# Patient Record
Sex: Female | Born: 1995 | Hispanic: Yes | Marital: Single | Smoking: Never smoker
Health system: Southern US, Community
[De-identification: ages and names within clinical notes are randomized; demographics above are authoritative.]

## PROBLEM LIST (undated history)

## (undated) DIAGNOSIS — G43909 Migraine, unspecified, not intractable, without status migrainosus: Secondary | ICD-10-CM

## (undated) DIAGNOSIS — F32A Depression, unspecified: Secondary | ICD-10-CM

## (undated) DIAGNOSIS — E282 Polycystic ovarian syndrome: Secondary | ICD-10-CM

## (undated) HISTORY — DX: Migraine, unspecified, not intractable, without status migrainosus: G43.909

## (undated) HISTORY — PX: NO PAST SURGERIES: SHX2092

## (undated) HISTORY — DX: Depression, unspecified: F32.A

---

## 2017-08-06 ENCOUNTER — Emergency Department (HOSPITAL_COMMUNITY): Payer: Self-pay

## 2017-08-06 ENCOUNTER — Encounter (HOSPITAL_COMMUNITY): Payer: Self-pay | Admitting: Emergency Medicine

## 2017-08-06 ENCOUNTER — Emergency Department (HOSPITAL_COMMUNITY)
Admission: EM | Admit: 2017-08-06 | Discharge: 2017-08-06 | Disposition: A | Discharge status: Home / Self Care | Payer: Self-pay | Attending: Emergency Medicine | Admitting: Emergency Medicine

## 2017-08-06 DIAGNOSIS — G43009 Migraine without aura, not intractable, without status migrainosus: Secondary | ICD-10-CM | POA: Insufficient documentation

## 2017-08-06 HISTORY — DX: Polycystic ovarian syndrome: E28.2

## 2017-08-06 HISTORY — DX: Migraine, unspecified, not intractable, without status migrainosus: G43.909

## 2017-08-06 LAB — CBC WITH DIFFERENTIAL/PLATELET
BASOS PCT: 0 %
Basophils Absolute: 0 10*3/uL (ref 0.0–0.1)
EOS ABS: 0.1 10*3/uL (ref 0.0–0.7)
Eosinophils Relative: 1 %
HCT: 42.4 % (ref 36.0–46.0)
Hemoglobin: 14.6 g/dL (ref 12.0–15.0)
Lymphocytes Relative: 19 %
Lymphs Abs: 2 10*3/uL (ref 0.7–4.0)
MCH: 31.1 pg (ref 26.0–34.0)
MCHC: 34.4 g/dL (ref 30.0–36.0)
MCV: 90.2 fL (ref 78.0–100.0)
MONO ABS: 0.5 10*3/uL (ref 0.1–1.0)
MONOS PCT: 5 %
Neutro Abs: 7.8 10*3/uL — ABNORMAL HIGH (ref 1.7–7.7)
Neutrophils Relative %: 75 %
PLATELETS: 344 10*3/uL (ref 150–400)
RBC: 4.7 MIL/uL (ref 3.87–5.11)
RDW: 12.4 % (ref 11.5–15.5)
WBC: 10.4 10*3/uL (ref 4.0–10.5)

## 2017-08-06 LAB — BASIC METABOLIC PANEL
Anion gap: 9 (ref 5–15)
BUN: 18 mg/dL (ref 6–20)
CALCIUM: 9.1 mg/dL (ref 8.9–10.3)
CO2: 25 mmol/L (ref 22–32)
CREATININE: 0.62 mg/dL (ref 0.44–1.00)
Chloride: 100 mmol/L — ABNORMAL LOW (ref 101–111)
Glucose, Bld: 105 mg/dL — ABNORMAL HIGH (ref 65–99)
Potassium: 3.8 mmol/L (ref 3.5–5.1)
SODIUM: 134 mmol/L — AB (ref 135–145)

## 2017-08-06 LAB — POC URINE PREG, ED: Preg Test, Ur: NEGATIVE

## 2017-08-06 MED ORDER — HYDROCODONE-ACETAMINOPHEN 5-325 MG PO TABS
2.0000 | ORAL_TABLET | Freq: Once | ORAL | Status: AC
  Administered 2017-08-06: 2 via ORAL
  Filled 2017-08-06: qty 2

## 2017-08-06 MED ORDER — PROMETHAZINE HCL 12.5 MG PO TABS
12.5000 mg | ORAL_TABLET | Freq: Once | ORAL | Status: AC
  Administered 2017-08-06: 12.5 mg via ORAL
  Filled 2017-08-06: qty 1

## 2017-08-06 MED ORDER — BUTALBITAL-APAP-CAFF-COD 50-325-40-30 MG PO CAPS: 1.0000 | ORAL_CAPSULE | ORAL | 0 refills | Status: DC | PRN

## 2017-08-06 NOTE — Discharge Instructions (Signed)
Your urine test and chemistry test are negative for acute problem. The CT scan of your head and neck are negative for acute problem. Please see Dr Neale Burly or the neurologist of your choice for additional work up of your headache and numbness and tingling. Your vital signs are within normal limits. No acute problem identified on today's exam.

## 2017-08-06 NOTE — ED Provider Notes (Signed)
[facility]-EMERGENCY DEPT Provider Note   CSN: [id number] Arrival date & time: 08/06/17  1914     History   Chief Complaint Chief Complaint  Patient presents with  . Headache    HPI TEQUISHA MAAHS is a 21 y.o. female.  Patient is a 21 year old female who presents to the emergency department with complaint of headache and numbness tingling of the left upper and lower extremity.  Patient speaks very little [place]. The online interpreter system was used during the interview and exam.  Patient states that she started having headache approximately 5 AM this morning. She states she has a history of migraine headaches since about age 27. This headache was a little more severe. This headache was accompanied by some blurring of vision and also later some numbness and tingling of tingling on the left side. The patient states that when she attempted to put on a glove at her work that she did not seem to be able to work her fingers in a way to make the glove work on her hand. She then presented to the emergency department. She has been worked up for this problem in [place], but no formal diagnosis as ever been given. The patient denies vomiting or diarrhea recently. No recent fever or chills. No recent injury to the head or neck it is of note that she suffers from polycystic ovarian disease, but no other significant medical problems other than the migraine headaches. She has not taken any medication for the headache so far today..      Past Medical History:  Diagnosis Date  . Migraine   . Polycystic ovaries     There are no active problems to display for this patient.   History reviewed. No pertinent surgical history.  OB History    Gravida Para Term Preterm AB Living   0 0 0 0 0 0   SAB TAB Ectopic Multiple Live Births   0 0 0 0 0       Home Medications    Prior to Admission medications   Not on File    Family History History reviewed. No pertinent family history.  Social  History Social History  Substance Use Topics  . Smoking status: Never Smoker  . Smokeless tobacco: Never Used  . Alcohol use No     Allergies   Patient has no known allergies.   Review of Systems Review of Systems  Constitutional: Negative for activity change.       All ROS Neg except as noted in HPI  HENT: Negative for nosebleeds.   Eyes: Negative for photophobia and discharge.  Respiratory: Negative for cough, shortness of breath and wheezing.   Cardiovascular: Negative for chest pain and palpitations.  Gastrointestinal: Negative for abdominal pain and blood in stool.  Genitourinary: Negative for dysuria, frequency and hematuria.  Musculoskeletal: Negative for arthralgias, back pain and neck pain.  Skin: Negative.   Neurological: Positive for numbness and headaches. Negative for dizziness, tremors, seizures, syncope, speech difficulty and weakness.  Psychiatric/Behavioral: Negative for confusion and hallucinations.     Physical Exam Updated Vital Signs BP (!) 104/55   Pulse 69   Temp 98.1 F (36.7 C) (Oral)   Resp 18   Ht 5' 0.24" (1.53 m)   Wt 65.9 kg (145 lb 3.5 oz)   LMP 07/31/2017   SpO2 100%   BMI 28.14 kg/m   Physical Exam  Constitutional: She appears well-developed and well-nourished. No distress.  HENT:  Head: Normocephalic and atraumatic.  Right Ear: External ear normal.  Left Ear: External ear normal.  Eyes: Conjunctivae are normal. Right eye exhibits no discharge. Left eye exhibits no discharge. No scleral icterus.  Neck: Neck supple. No tracheal deviation present.  Cardiovascular: Normal rate, regular rhythm and intact distal pulses.   Pulmonary/Chest: Effort normal and breath sounds normal. No stridor. No respiratory distress. She has no wheezes. She has no rales.  Abdominal: Soft. Bowel sounds are normal. She exhibits no distension. There is no tenderness. There is no rebound and no guarding.  Musculoskeletal: She exhibits no edema or tenderness.   Neurological: She is alert. She has normal strength. No cranial nerve deficit (no facial droop, extraocular movements intact, no slurred speech). She exhibits normal muscle tone. She displays no seizure activity. Coordination normal.  Patient states that she can feel touch on the left side, but it feels like a tingle or tickle more than anything else.  Skin: Skin is warm and dry. No rash noted.  Psychiatric: She has a normal mood and affect.  Nursing note and vitals reviewed.    ED Treatments / Results  Labs (all labs ordered are listed, but only abnormal results are displayed) Labs Reviewed  CBC WITH DIFFERENTIAL/PLATELET - Abnormal; Notable for the following:       Result Value   Neutro Abs 7.8 (*)    All other components within normal limits  BASIC METABOLIC PANEL - Abnormal; Notable for the following:    Sodium 134 (*)    Chloride 100 (*)    Glucose, Bld 105 (*)    All other components within normal limits  POC URINE PREG, ED    EKG  EKG Interpretation None       Radiology Ct Head Wo Contrast  Result Date: 08/06/2017 CLINICAL DATA:  Woke up at 5 a.m. today with severe headache. Left-sided numbness. Patient has history of migraines. EXAM: CT HEAD WITHOUT CONTRAST CT CERVICAL SPINE WITHOUT CONTRAST TECHNIQUE: Multidetector CT imaging of the head and cervical spine was performed following the standard protocol without intravenous contrast. Multiplanar CT image reconstructions of the cervical spine were also generated. COMPARISON:  None. FINDINGS: CT HEAD FINDINGS Brain: No evidence of infarction, hemorrhage, hydrocephalus, extra-axial collection or mass lesion/mass effect. Vascular: No hyperdense vessel or unexpected calcification. Skull: Right forehead scarring. Negative for fracture or focal lesion. Sinuses/Orbits: Negative CT CERVICAL SPINE FINDINGS Alignment: Unremarkable for position Skull base and vertebrae: No acute fracture. No primary bone lesion or focal pathologic  process. Soft tissues and spinal canal: No prevertebral fluid or swelling. No visible canal hematoma. Disc levels:  No degenerative change Upper chest: Negative IMPRESSION: Negative head and cervical spine CT. Electronically Signed   By: Marnee Spring M.D.   On: 08/06/2017 12:07   Ct Cervical Spine Wo Contrast  Result Date: 08/06/2017 CLINICAL DATA:  Woke up at 5 a.m. today with severe headache. Left-sided numbness. Patient has history of migraines. EXAM: CT HEAD WITHOUT CONTRAST CT CERVICAL SPINE WITHOUT CONTRAST TECHNIQUE: Multidetector CT imaging of the head and cervical spine was performed following the standard protocol without intravenous contrast. Multiplanar CT image reconstructions of the cervical spine were also generated. COMPARISON:  None. FINDINGS: CT HEAD FINDINGS Brain: No evidence of infarction, hemorrhage, hydrocephalus, extra-axial collection or mass lesion/mass effect. Vascular: No hyperdense vessel or unexpected calcification. Skull: Right forehead scarring. Negative for fracture or focal lesion. Sinuses/Orbits: Negative CT CERVICAL SPINE FINDINGS Alignment: Unremarkable for position Skull base and vertebrae: No acute fracture. No primary bone lesion or focal  pathologic process. Soft tissues and spinal canal: No prevertebral fluid or swelling. No visible canal hematoma. Disc levels:  No degenerative change Upper chest: Negative IMPRESSION: Negative head and cervical spine CT. Electronically Signed   By: Marnee Spring M.D.   On: 08/06/2017 12:07    Procedures Procedures (including critical care time)  Medications Ordered in ED Medications - No data to display   Initial Impression / Assessment and Plan / ED Course  I have reviewed the triage vital signs and the nursing notes.  Pertinent labs & imaging results that were available during my care of the patient were reviewed by me and considered in my medical decision making (see chart for details).       Final Clinical  Impressions(s) / ED Diagnoses MDM Vital signs reviewed. Patient presented with headache, tingling and numbness of the left side.  The urine pregnancy test is negative. The complete blood count and basic metabolic panel are nonacute.  CT scan of the head and neck are negative for acute problem. I suspect that the symptoms are related to an atypical migraine. The patient will be given a prescription for her set codeine to use for her headache pain. She is referred to the headache wellness Center and Dr. Neale Burly. I've discussed these discharge instructions through the online interpretation system. Questions were answered. Patient acknowledges understanding of the discharge instructions.    Final diagnoses:  Atypical migraine    New Prescriptions New Prescriptions   No medications on file     Duayne Cal 08/07/17 2154    Mesner, Barbara Cower, MD 08/08/17 [phone]

## 2017-08-06 NOTE — ED Triage Notes (Signed)
Patient c/o severe headache that started this morning at 5. Patient states through language line that her vision started getting blurry and she had numbness and pain to left side of body with decreased ROM-no weakness. Dr Clayborne Dana aware and in room assessing patient. Patient states hx of migraines and that she has had "problems with numbness in past since she was 12 and doctors in [place] could not figure out what is going on with her." No code stroke called per Dr Clayborne Dana. Patient denies sensitivity to light or sound. Reports nausea but no vomiting.   Patient Hispanic interpreter Gabriel Rung [phone] used.

## 2021-05-28 ENCOUNTER — Encounter (HOSPITAL_COMMUNITY): Payer: Self-pay

## 2021-05-28 ENCOUNTER — Other Ambulatory Visit: Payer: Self-pay

## 2021-05-28 ENCOUNTER — Emergency Department (HOSPITAL_COMMUNITY)
Admission: EM | Admit: 2021-05-28 | Discharge: 2021-05-28 | Disposition: A | Discharge status: Home / Self Care | Payer: Self-pay | Attending: Emergency Medicine | Admitting: Emergency Medicine

## 2021-05-28 ENCOUNTER — Emergency Department (HOSPITAL_COMMUNITY): Payer: Self-pay

## 2021-05-28 DIAGNOSIS — Z3490 Encounter for supervision of normal pregnancy, unspecified, unspecified trimester: Secondary | ICD-10-CM

## 2021-05-28 DIAGNOSIS — Z3A01 Less than 8 weeks gestation of pregnancy: Secondary | ICD-10-CM | POA: Insufficient documentation

## 2021-05-28 DIAGNOSIS — B9689 Other specified bacterial agents as the cause of diseases classified elsewhere: Secondary | ICD-10-CM | POA: Insufficient documentation

## 2021-05-28 DIAGNOSIS — O2341 Unspecified infection of urinary tract in pregnancy, first trimester: Secondary | ICD-10-CM | POA: Insufficient documentation

## 2021-05-28 DIAGNOSIS — R102 Pelvic and perineal pain: Secondary | ICD-10-CM | POA: Insufficient documentation

## 2021-05-28 LAB — CBC WITH DIFFERENTIAL/PLATELET
Abs Immature Granulocytes: 0.02 10*3/uL (ref 0.00–0.07)
Basophils Absolute: 0.1 10*3/uL (ref 0.0–0.1)
Basophils Relative: 1 %
Eosinophils Absolute: 0.1 10*3/uL (ref 0.0–0.5)
Eosinophils Relative: 1 %
HCT: 36.2 % (ref 36.0–46.0)
Hemoglobin: 12.2 g/dL (ref 12.0–15.0)
Immature Granulocytes: 0 %
Lymphocytes Relative: 29 %
Lymphs Abs: 2.1 10*3/uL (ref 0.7–4.0)
MCH: 31.8 pg (ref 26.0–34.0)
MCHC: 33.7 g/dL (ref 30.0–36.0)
MCV: 94.3 fL (ref 80.0–100.0)
Monocytes Absolute: 0.7 10*3/uL (ref 0.1–1.0)
Monocytes Relative: 9 %
Neutro Abs: 4.3 10*3/uL (ref 1.7–7.7)
Neutrophils Relative %: 60 %
Platelets: 254 10*3/uL (ref 150–400)
RBC: 3.84 MIL/uL — ABNORMAL LOW (ref 3.87–5.11)
RDW: 12.6 % (ref 11.5–15.5)
WBC: 7.2 10*3/uL (ref 4.0–10.5)
nRBC: 0 % (ref 0.0–0.2)

## 2021-05-28 LAB — COMPREHENSIVE METABOLIC PANEL
ALT: 12 U/L (ref 0–44)
AST: 16 U/L (ref 15–41)
Albumin: 4.3 g/dL (ref 3.5–5.0)
Alkaline Phosphatase: 50 U/L (ref 38–126)
Anion gap: 8 (ref 5–15)
BUN: 12 mg/dL (ref 6–20)
CO2: 22 mmol/L (ref 22–32)
Calcium: 9.2 mg/dL (ref 8.9–10.3)
Chloride: 105 mmol/L (ref 98–111)
Creatinine, Ser: 0.5 mg/dL (ref 0.44–1.00)
GFR, Estimated: >60 mL/min (ref >60)
Glucose, Bld: 87 mg/dL (ref 70–99)
Potassium: 4.3 mmol/L (ref 3.5–5.1)
Sodium: 135 mmol/L (ref 135–145)
Total Bilirubin: 1 mg/dL (ref 0.3–1.2)
Total Protein: 7.6 g/dL (ref 6.5–8.1)

## 2021-05-28 LAB — URINALYSIS, ROUTINE W REFLEX MICROSCOPIC
Bacteria, UA: NONE SEEN
Bilirubin Urine: NEGATIVE
Glucose, UA: NEGATIVE mg/dL
Ketones, ur: 5 mg/dL — AB
Nitrite: NEGATIVE
Protein, ur: NEGATIVE mg/dL
Specific Gravity, Urine: 1.02 (ref 1.005–1.030)
WBC, UA: >50 WBC/hpf — ABNORMAL HIGH (ref 0–5)
pH: 7 (ref 5.0–8.0)

## 2021-05-28 LAB — HCG, QUANTITATIVE, PREGNANCY: hCG, Beta Chain, Quant, S: 88057 m[IU]/mL — ABNORMAL HIGH (ref <5)

## 2021-05-28 LAB — POC URINE PREG, ED: Preg Test, Ur: POSITIVE — AB

## 2021-05-28 MED ORDER — CEPHALEXIN 500 MG PO CAPS
500.0000 mg | ORAL_CAPSULE | Freq: Once | ORAL | Status: AC
  Administered 2021-05-28: 500 mg via ORAL
  Filled 2021-05-28: qty 1

## 2021-05-28 MED ORDER — CEPHALEXIN 500 MG PO CAPS: 500.0000 mg | ORAL_CAPSULE | Freq: Four times a day (QID) | ORAL | 0 refills | Status: DC

## 2021-05-28 NOTE — Discharge Instructions (Addendum)
Follow-up with Dr.Eure or one of his partners next week.  Call and make an appointment.

## 2021-05-28 NOTE — ED Triage Notes (Signed)
Pt arrived in POV speaking only Spanish. Interpreter in use. C?O lower mid abdomin pain, lower mid back pain and wants to confirm her pregnancy test is positive. Last menstrual June 6th,

## 2021-05-28 NOTE — ED Provider Notes (Signed)
[facility] EMERGENCY DEPARTMENT Provider Note   CSN: [id number] Arrival date & time: 05/28/21  0915     History Chief Complaint  Patient presents with   Abdominal Pain    Theresa Chase is a 25 y.o. female.  Patient complains of suprapubic discomfort.  She states she did a home pregnancy test that was positive.  No bleeding vaginally  The history is provided by the patient and medical records. No language interpreter was used.  Abdominal Pain Pain location:  Suprapubic Pain quality: aching   Pain radiates to:  Does not radiate Pain severity:  Mild Onset quality:  Sudden Duration:  1 day Timing:  Intermittent Progression:  Unchanged Chronicity:  New Context: not alcohol use   Associated symptoms: no chest pain, no cough, no diarrhea, no fatigue and no hematuria       Past Medical History:  Diagnosis Date   Migraine    Polycystic ovaries     There are no problems to display for this patient.   History reviewed. No pertinent surgical history.   OB History     Gravida  1   Para  0   Term  0   Preterm  0   AB  0   Living  0      SAB  0   IAB  0   Ectopic  0   Multiple  0   Live Births  0           No family history on file.  Social History   Tobacco Use   Smoking status: Never   Smokeless tobacco: Never  Vaping Use   Vaping Use: Never used  Substance Use Topics   Alcohol use: Yes    Alcohol/week: 1.0 standard drink    Types: 1 Cans of beer per week    Comment: occassional   Drug use: No    Home Medications Prior to Admission medications   Medication Sig Start Date End Date Taking? Authorizing Provider  cephALEXin (KEFLEX) 500 MG capsule Take 1 capsule (500 mg total) by mouth 4 (four) times daily. 05/28/21  Yes Bethann Berkshire, MD  butalbital-acetaminophen-caffeine (FIORICET/CODEINE) [phone] MG capsule Take 1 capsule by mouth every 4 (four) hours as needed for headache. 08/06/17   Ivery Quale, PA-C    Allergies     Patient has no known allergies.  Review of Systems   Review of Systems  Constitutional:  Negative for appetite change and fatigue.  HENT:  Negative for congestion, ear discharge and sinus pressure.   Eyes:  Negative for discharge.  Respiratory:  Negative for cough.   Cardiovascular:  Negative for chest pain.  Gastrointestinal:  Positive for abdominal pain. Negative for diarrhea.  Genitourinary:  Negative for frequency and hematuria.  Musculoskeletal:  Negative for back pain.  Skin:  Negative for rash.  Neurological:  Negative for seizures and headaches.  Psychiatric/Behavioral:  Negative for hallucinations.    Physical Exam Updated Vital Signs BP (!) 95/58   Pulse 67   Temp 98.8 F (37.1 C) (Oral)   Resp (!) 25   Ht 5\' 8"  (1.727 m)   Wt 54.4 kg   LMP 04/18/2021 Comment: took home pregnancy test yesterday  SpO2 100%   BMI 18.25 kg/m   Physical Exam Vitals and nursing note reviewed.  Constitutional:      Appearance: She is well-developed.  HENT:     Head: Normocephalic.     Mouth/Throat:     Mouth: Mucous membranes are  moist.  Eyes:     General: No scleral icterus.    Conjunctiva/sclera: Conjunctivae normal.  Neck:     Thyroid: No thyromegaly.  Cardiovascular:     Rate and Rhythm: Normal rate and regular rhythm.     Heart sounds: No murmur heard.   No friction rub. No gallop.  Pulmonary:     Breath sounds: No stridor. No wheezing or rales.  Chest:     Chest wall: No tenderness.  Abdominal:     General: There is no distension.     Tenderness: There is abdominal tenderness. There is no rebound.     Comments: Suprapubic tenderness  Musculoskeletal:        General: Normal range of motion.     Cervical back: Neck supple.  Lymphadenopathy:     Cervical: No cervical adenopathy.  Skin:    Findings: No erythema or rash.  Neurological:     Mental Status: She is alert and oriented to person, place, and time.     Motor: No abnormal muscle tone.     Coordination:  Coordination normal.  Psychiatric:        Behavior: Behavior normal.    ED Results / Procedures / Treatments   Labs (all labs ordered are listed, but only abnormal results are displayed) Labs Reviewed  URINALYSIS, ROUTINE W REFLEX MICROSCOPIC - Abnormal; Notable for the following components:      Result Value   APPearance HAZY (*)    Hgb urine dipstick SMALL (*)    Ketones, ur 5 (*)    Leukocytes,Ua MODERATE (*)    WBC, UA >50 (*)    Non Squamous Epithelial 0-5 (*)    All other components within normal limits  HCG, QUANTITATIVE, PREGNANCY - Abnormal; Notable for the following components:   hCG, Beta Chain, Quant, S 88,057 (*)    All other components within normal limits  CBC WITH DIFFERENTIAL/PLATELET - Abnormal; Notable for the following components:   RBC 3.84 (*)    All other components within normal limits  POC URINE PREG, ED - Abnormal; Notable for the following components:   Preg Test, Ur POSITIVE (*)    All other components within normal limits  URINE CULTURE  COMPREHENSIVE METABOLIC PANEL  CBC WITH DIFFERENTIAL/PLATELET    EKG None  Radiology US OB LESS THAN 14 WEEKS WITH OB TRANSVAGINAL  Result Date: 05/28/2021 CLINICAL DATA:  25 year old pregnant female with mid pelvic and back pain. EXAM: OBSTETRIC <14 WK [place] AND TRANSVAGINAL OB US TECHNIQUE: Both transabdominal and transvaginal ultrasound examinations were performed for complete evaluation of the gestation as well as the maternal uterus, adnexal regions, and pelvic cul-de-sac. Transvaginal technique was performed to assess early pregnancy. COMPARISON:  None. FINDINGS: Intrauterine gestational sac: Single Yolk sac:  Visualized. Embryo:  Visualized. Cardiac Activity: Visualized. Heart Rate: 152 bpm CRL:  5.6 mm   6 w   2 d                  [place] EDC: 01/29/2022 Subchorionic hemorrhage:  None visualized. Maternal uterus/adnexae: Normal appearance of the ovaries. Probable corpus luteum history a change on the right. Trace  free fluid in the anatomic pelvis. IMPRESSION: Single viable intrauterine pregnancy. Electronically Signed   By: Malachy Moan M.D.   On: 05/28/2021 12:28    Procedures Procedures   Medications Ordered in ED Medications  cephALEXin (KEFLEX) capsule 500 mg (has no administration in time range)    ED Course  I have reviewed the triage  vital signs and the nursing notes.  Pertinent labs & imaging results that were available during my care of the patient were reviewed by me and considered in my medical decision making (see chart for details). Patient with intrauterine pregnancy that is about 6 weeks and 2 days.  She also has urinary tract infection and she will be placed on Keflex and follow-up with OB/GYN   MDM Rules/Calculators/A&P                          Intrauterine pregnancy and UTI.  Patient on Keflex Final Clinical Impression(s) / ED Diagnoses Final diagnoses:  Pregnancy    Rx / DC Orders ED Discharge Orders          Ordered    cephALEXin (KEFLEX) 500 MG capsule  4 times daily        05/28/21 1243             Bethann Berkshire, MD 05/28/21 1249

## 2021-05-30 LAB — URINE CULTURE: Culture: 30000 — AB

## 2021-07-14 ENCOUNTER — Other Ambulatory Visit: Payer: Self-pay

## 2021-07-14 ENCOUNTER — Ambulatory Visit: Payer: Self-pay | Admitting: Physician Assistant

## 2021-07-14 VITALS — BP 97/42 | HR 64 | Temp 98.1°F | Ht 64.0 in | Wt 130.0 lb

## 2021-07-14 DIAGNOSIS — Z3401 Encounter for supervision of normal first pregnancy, first trimester: Secondary | ICD-10-CM

## 2021-07-14 DIAGNOSIS — O0990 Supervision of high risk pregnancy, unspecified, unspecified trimester: Secondary | ICD-10-CM | POA: Insufficient documentation

## 2021-07-14 DIAGNOSIS — O2340 Unspecified infection of urinary tract in pregnancy, unspecified trimester: Secondary | ICD-10-CM

## 2021-07-14 DIAGNOSIS — O2341 Unspecified infection of urinary tract in pregnancy, first trimester: Secondary | ICD-10-CM

## 2021-07-14 DIAGNOSIS — Z8669 Personal history of other diseases of the nervous system and sense organs: Secondary | ICD-10-CM | POA: Insufficient documentation

## 2021-07-14 DIAGNOSIS — Z3400 Encounter for supervision of normal first pregnancy, unspecified trimester: Secondary | ICD-10-CM

## 2021-07-14 HISTORY — DX: Unspecified infection of urinary tract in pregnancy, unspecified trimester: O23.40

## 2021-07-14 HISTORY — DX: Supervision of high risk pregnancy, unspecified, unspecified trimester: O09.90

## 2021-07-14 LAB — HEMOGLOBIN, FINGERSTICK: Hemoglobin: 12.9 g/dL (ref 11.1–15.9)

## 2021-07-14 LAB — URINALYSIS
Bilirubin, UA: NEGATIVE
Glucose, UA: NEGATIVE
Ketones, UA: NEGATIVE
Nitrite, UA: NEGATIVE
Protein,UA: NEGATIVE
RBC, UA: NEGATIVE
Specific Gravity, UA: 1.02 (ref 1.005–1.030)
Urobilinogen, Ur: 0.2 mg/dL (ref 0.2–1.0)
pH, UA: 7 (ref 5.0–7.5)

## 2021-07-14 MED ORDER — PRENATAL MULTIVITAMIN CH: 1.0000 | ORAL_TABLET | Freq: Every day | ORAL | 0 refills | Status: AC

## 2021-07-14 MED ORDER — ASPIRIN EC 81 MG PO TBEC
81.0000 mg | DELAYED_RELEASE_TABLET | Freq: Every day | ORAL | Status: AC
Start: 2021-07-14 — End: 2021-12-29

## 2021-07-14 NOTE — Progress Notes (Signed)
[facility] HEALTH DEPT [facility] [address] [place] [address] [phone]  INITIAL PRENATAL VISIT NOTE  Subjective:  Theresa Chase is a 25 y.o. G1P0000 at [redacted]w[redacted]d being seen today to start prenatal care at the [facility].  She is currently monitored for the following issues for this low-risk pregnancy and has Supervision of normal first pregnancy, antepartum; UTI in pregnancy, antepartum; and Hx of migraine headaches on their problem list.  Patient reports nausea.  Contractions: Not present. Vag. Bleeding: None.  Movement: Absent. Nausea well-controlled with occ Vit B6/Unisom. Denies leaking of fluid.   Indications for ASA therapy (per uptodate) One of the following: Previous pregnancy with preeclampsia, especially early onset and with an adverse outcome No Multifetal gestation No Chronic hypertension No Type 1 or 2 diabetes mellitus No Chronic kidney disease No Autoimmune disease (antiphospholipid syndrome, systemic lupus erythematosus) No  Two or more of the following: Nulliparity Yes Obesity (body mass index >30 kg/m2) No Family history of preeclampsia in mother or sister No Age ?35 years No Sociodemographic characteristics (African American race, low socioeconomic level) Yes Personal risk factors (eg, previous pregnancy with low birth weight or small for gestational age infant, previous adverse pregnancy outcome [eg, stillbirth], interval >10 years between pregnancies) No   The following portions of the patient's history were reviewed and updated as appropriate: allergies, current medications, past family history, past medical history, past social history, past surgical history and problem list. Problem list updated.  Objective:   Vitals:   07/14/21 1341 07/14/21 1342  BP: (!) 79/42   Pulse: 64   Temp: 98.1 F (36.7 C)   Weight: 130 lb (59 kg)   Height:  5\' 4"  (1.626 m)    Fetal Status:  Fetal Heart Rate (bpm): 152 Fundal Height: 12 cm Movement: Absent  Presentation: Undeterminable   Physical Exam Vitals and nursing note reviewed.  Constitutional:      General: She is not in acute distress.    Appearance: Normal appearance. She is well-developed.  HENT:     Head: Normocephalic and atraumatic.     Right Ear: External ear normal.     Left Ear: External ear normal.     Nose: Nose normal. No congestion or rhinorrhea.     Mouth/Throat:     Lips: Pink.     Mouth: Mucous membranes are moist.     Dentition: Normal dentition. No dental caries.     Pharynx: Oropharynx is clear. Uvula midline.     Comments: Dentition: Teeth in good repair Eyes:     General: No scleral icterus.    Conjunctiva/sclera: Conjunctivae normal.  Neck:     Thyroid: No thyroid mass or thyromegaly.  Cardiovascular:     Rate and Rhythm: Normal rate.     Pulses: Normal pulses.     Comments: Extremities are warm and well perfused Pulmonary:     Effort: Pulmonary effort is normal.     Breath sounds: Normal breath sounds.  Chest:     Chest wall: No mass.  Breasts:    Tanner Score is 5.     Breasts are symmetrical.     Right: Normal. No mass, nipple discharge or skin change.     Left: Normal. No mass, nipple discharge or skin change.  Abdominal:     General: Abdomen is flat.     Palpations: Abdomen is soft.     Tenderness: There is no abdominal tenderness.  Comments: Gravid   Genitourinary:    General: Normal vulva.     Exam position: Lithotomy position.     Pubic Area: No rash.      Labia:        Right: No rash.        Left: No rash.      Vagina: Normal. No vaginal discharge.     Cervix: No cervical motion tenderness or friability.     Uterus: Normal. Enlarged (Gravid 10-12 wk size). Not tender.      Adnexa: Right adnexa normal and left adnexa normal.     Rectum: Normal. No external hemorrhoid.  Musculoskeletal:     Right lower leg: No edema.     Left lower leg: No edema.   Lymphadenopathy:     Cervical: No cervical adenopathy.     Upper Body:     Right upper body: No axillary adenopathy.     Left upper body: No axillary adenopathy.  Skin:    General: Skin is warm.     Capillary Refill: Capillary refill takes less than 2 seconds.  Neurological:     Mental Status: She is alert.    Assessment and Plan:  Pregnancy: G1P0000 at [redacted]w[redacted]d  1. Supervision of normal first pregnancy, antepartum OBCM referral done (ED use, poor social support). EPDS = 4. Desires first screen, referral done. - Pap IG (Image Guided) - Urinalysis - Hemoglobin, fingerstick - HIV-1/HIV-2 Qualitative RNA - Prenatal Profile with Varicella(282020) - HCV Ab w Reflex to Quant PCR - Urine Culture - Chlamydia/GC NAA, Confirmation - Hemoglobinopathy evaluation -[id number] - QuantiFERON-TB Gold Plus  2. UTI in pregnancy, antepartum Completed antibiotic, repeat urine C&S for TOC today.  3. Hx of migraine headaches Take Tylenol at first sign of HA. Monitor for severity/frequency.  Discussed overview of care and coordination with inpatient delivery practices including [facility], [facility], [facility] and [facility].   Due to language barrier, an interpreter (M. [place]) was present during the provider portion of the visit with this patient.  Preterm labor symptoms and general obstetric precautions including but not limited to vaginal bleeding, contractions, leaking of fluid and fetal movement were reviewed in detail with the patient.  Please refer to After Visit Summary for other counseling recommendations.   Return in about 4 weeks (around 08/11/2021) for Routine prenatal care.  No future appointments.  Landry Dyke, PA-C

## 2021-07-14 NOTE — Progress Notes (Signed)
First tri screen faxed 07/14/21 with confirmation.   Urine dip and hgb reviewed during clinic visit.   Floy Sabina, RN

## 2021-07-15 ENCOUNTER — Telehealth: Payer: Self-pay

## 2021-07-15 NOTE — Telephone Encounter (Signed)
TC from [place] at [facility]. No appointment for first trimester screen available before patient goes to [place] on 07/25/2021. Per visit notes, patient plans to return from [place] on 08/12/2021 at which time she will be greater than 13 6/7, and will not be able to do 1st trimester screen. TC to patient to offer her other genetic screening options for after her trip, or to see if she's willing to go to [facility] for 1st trimester screen if they have an appointment.  Unable to leave message, VM not set up.TC to patient emergency contact, LM with number to call.Marland KitchenMarland KitchenInterpreter, Juliene Pina.Marland KitchenMarland KitchenBurt Knack, RN

## 2021-07-18 LAB — HIV-1/HIV-2 QUALITATIVE RNA
HIV-1 RNA, Qualitative: NONREACTIVE
HIV-2 RNA, Qualitative: NONREACTIVE

## 2021-07-18 LAB — HGB FRACTIONATION CASCADE
Hgb A2: 2.7 % (ref 1.8–3.2)
Hgb A: 97.3 % (ref 96.4–98.8)
Hgb F: 0 % (ref 0.0–2.0)
Hgb S: 0 %

## 2021-07-18 LAB — URINE CULTURE

## 2021-07-19 ENCOUNTER — Telehealth: Payer: Self-pay

## 2021-07-19 DIAGNOSIS — A568 Sexually transmitted chlamydial infection of other sites: Secondary | ICD-10-CM | POA: Insufficient documentation

## 2021-07-19 DIAGNOSIS — O98311 Other infections with a predominantly sexual mode of transmission complicating pregnancy, first trimester: Secondary | ICD-10-CM | POA: Insufficient documentation

## 2021-07-19 DIAGNOSIS — R7612 Nonspecific reaction to cell mediated immunity measurement of gamma interferon antigen response without active tuberculosis: Secondary | ICD-10-CM | POA: Insufficient documentation

## 2021-07-19 HISTORY — DX: Sexually transmitted chlamydial infection of other sites: A56.8

## 2021-07-19 LAB — CBC/D/PLT+RPR+RH+ABO+RUB AB...
Antibody Screen: NEGATIVE
Basophils Absolute: 0.1 10*3/uL (ref 0.0–0.2)
Basos: 1 %
EOS (ABSOLUTE): 0.2 10*3/uL (ref 0.0–0.4)
Eos: 2 %
Hematocrit: 38.1 % (ref 34.0–46.6)
Hemoglobin: 13 g/dL (ref 11.1–15.9)
Hepatitis B Surface Ag: NEGATIVE
Immature Grans (Abs): 0 10*3/uL (ref 0.0–0.1)
Immature Granulocytes: 0 %
Lymphocytes Absolute: 2.4 10*3/uL (ref 0.7–3.1)
Lymphs: 21 %
MCH: 31.5 pg (ref 26.6–33.0)
MCHC: 34.1 g/dL (ref 31.5–35.7)
MCV: 92 fL (ref 79–97)
Monocytes Absolute: 0.8 10*3/uL (ref 0.1–0.9)
Monocytes: 7 %
Neutrophils Absolute: 7.8 10*3/uL — ABNORMAL HIGH (ref 1.4–7.0)
Neutrophils: 69 %
Platelets: 305 10*3/uL (ref 150–450)
RBC: 4.13 x10E6/uL (ref 3.77–5.28)
RDW: 13.3 % (ref 11.7–15.4)
RPR Ser Ql: NONREACTIVE
Rh Factor: POSITIVE
Rubella Antibodies, IGG: 6.75 index (ref >0.99)
Varicella zoster IgG: 1479 index (ref >165)
WBC: 11.3 10*3/uL — ABNORMAL HIGH (ref 3.4–10.8)

## 2021-07-19 LAB — HCV AB W REFLEX TO QUANT PCR: HCV Ab: <0.1 s/co ratio (ref 0.0–0.9)

## 2021-07-19 LAB — QUANTIFERON-TB GOLD PLUS
QuantiFERON Mitogen Value: 8.24 IU/mL
QuantiFERON Nil Value: 0.04 IU/mL
QuantiFERON TB1 Ag Value: 2.47 IU/mL
QuantiFERON TB2 Ag Value: 2.35 IU/mL
QuantiFERON-TB Gold Plus: POSITIVE — AB

## 2021-07-19 LAB — CHLAMYDIA/GC NAA, CONFIRMATION
Chlamydia trachomatis, NAA: POSITIVE — AB
Neisseria gonorrhoeae, NAA: NEGATIVE

## 2021-07-19 LAB — C. TRACHOMATIS NAA, CONFIRM: C. trachomatis NAA, Confirm: POSITIVE — AB

## 2021-07-19 LAB — HCV INTERPRETATION

## 2021-07-19 NOTE — Telephone Encounter (Signed)
Call to client with Theresa Chase to notify her of need for chlamydia treatment. Left message to call regarding test results and number to call provided. Jossie Ng, RN

## 2021-07-19 NOTE — Telephone Encounter (Signed)
Call to client with Marlene Yemen regarding need for chlamydia treatment and was going to address first screening at that time. Roddie Mc Yemen left message requesting client call [facility] and number provided. Jossie Ng, RN

## 2021-07-20 ENCOUNTER — Ambulatory Visit (LOCAL_COMMUNITY_HEALTH_CENTER): Payer: Self-pay

## 2021-07-20 ENCOUNTER — Other Ambulatory Visit: Payer: Self-pay

## 2021-07-20 VITALS — Ht 64.0 in | Wt 130.0 lb

## 2021-07-20 DIAGNOSIS — R7612 Nonspecific reaction to cell mediated immunity measurement of gamma interferon antigen response without active tuberculosis: Secondary | ICD-10-CM

## 2021-07-20 LAB — PAP IG (IMAGE GUIDED): PAP Smear Comment: 0

## 2021-07-20 NOTE — Progress Notes (Signed)
EPI completed via phone d/t recent labs and history completed in maternity clinic. Patient states was born in the [place] but moved to [place] at 13 months of age.  [place] she was 25 yo. Patient states parents informed her she was really sick with bronchitis/pneumonia as an infant after arriving in [place].  Denies asthma but states "when I get sick with the flu or a virus, I can't breath well".  Discussed LTBI vs Active TB. [facility] [place] [facility] CX[facility] 07/21/2021. Richmond Campbell, RN     Interpreter V. Olmedo

## 2021-07-21 ENCOUNTER — Other Ambulatory Visit: Payer: Self-pay | Admitting: Family Medicine

## 2021-07-21 DIAGNOSIS — R7612 Nonspecific reaction to cell mediated immunity measurement of gamma interferon antigen response without active tuberculosis: Secondary | ICD-10-CM

## 2021-07-21 NOTE — Telephone Encounter (Signed)
Contacted patient regarding positive chlamydia results. Appointment made for 07/22/21.   Floy Sabina, RN

## 2021-07-22 ENCOUNTER — Other Ambulatory Visit: Payer: Self-pay

## 2021-07-22 ENCOUNTER — Ambulatory Visit: Payer: Self-pay | Admitting: Advanced Practice Midwife

## 2021-07-22 VITALS — BP 97/49 | HR 80 | Temp 97.9°F | Wt 132.8 lb

## 2021-07-22 DIAGNOSIS — A568 Sexually transmitted chlamydial infection of other sites: Secondary | ICD-10-CM

## 2021-07-22 DIAGNOSIS — O98311 Other infections with a predominantly sexual mode of transmission complicating pregnancy, first trimester: Secondary | ICD-10-CM

## 2021-07-22 MED ORDER — AZITHROMYCIN 500 MG PO TABS: 1000.0000 mg | ORAL_TABLET | Freq: Every day | ORAL | 0 refills | Status: DC

## 2021-07-22 NOTE — Progress Notes (Signed)
Patient here for Chlamydia treatment. Treated per SO. Azithromycin 100mg  PO given today. Contact card given and information pamphlet on chlamydia given as well. All questions answered.    Patient also aware that we are unable to scheduled first tri screen as appointment could not be made before travel dates and patient will be past 14 weeks upon return. Offer QUAD screen on next MH RV on 08/17/21.   10/17/21, RN

## 2021-07-23 ENCOUNTER — Ambulatory Visit
Admission: RE | Admit: 2021-07-23 | Discharge: 2021-07-23 | Disposition: A | Discharge status: Home / Self Care | Payer: Self-pay | Source: Ambulatory Visit | Attending: Family Medicine | Admitting: Family Medicine

## 2021-07-23 ENCOUNTER — Ambulatory Visit
Admission: RE | Admit: 2021-07-23 | Discharge: 2021-07-23 | Disposition: A | Discharge status: Home / Self Care | Payer: Self-pay | Attending: Family Medicine | Admitting: Family Medicine

## 2021-07-23 DIAGNOSIS — R7612 Nonspecific reaction to cell mediated immunity measurement of gamma interferon antigen response without active tuberculosis: Secondary | ICD-10-CM | POA: Insufficient documentation

## 2021-07-25 ENCOUNTER — Telehealth: Payer: Self-pay

## 2021-07-25 DIAGNOSIS — R7612 Nonspecific reaction to cell mediated immunity measurement of gamma interferon antigen response without active tuberculosis: Secondary | ICD-10-CM

## 2021-07-25 NOTE — Telephone Encounter (Signed)
TC with patient. Informed normal CXR. Patient is interested in LTBI tx after delivery.  Will add to Rifampin list. .Richmond Campbell, RN

## 2021-08-17 ENCOUNTER — Other Ambulatory Visit: Payer: Self-pay

## 2021-08-17 ENCOUNTER — Ambulatory Visit: Payer: Self-pay | Admitting: Advanced Practice Midwife

## 2021-08-17 ENCOUNTER — Telehealth: Payer: Self-pay

## 2021-08-17 VITALS — BP 105/59 | HR 69 | Temp 98.5°F | Wt 136.4 lb

## 2021-08-17 DIAGNOSIS — A568 Sexually transmitted chlamydial infection of other sites: Secondary | ICD-10-CM

## 2021-08-17 DIAGNOSIS — B9689 Other specified bacterial agents as the cause of diseases classified elsewhere: Secondary | ICD-10-CM

## 2021-08-17 DIAGNOSIS — N760 Acute vaginitis: Secondary | ICD-10-CM

## 2021-08-17 DIAGNOSIS — O98311 Other infections with a predominantly sexual mode of transmission complicating pregnancy, first trimester: Secondary | ICD-10-CM

## 2021-08-17 DIAGNOSIS — Z3400 Encounter for supervision of normal first pregnancy, unspecified trimester: Secondary | ICD-10-CM

## 2021-08-17 DIAGNOSIS — O2340 Unspecified infection of urinary tract in pregnancy, unspecified trimester: Secondary | ICD-10-CM

## 2021-08-17 LAB — WET PREP FOR TRICH, YEAST, CLUE
Trichomonas Exam: NEGATIVE
Yeast Exam: NEGATIVE

## 2021-08-17 MED ORDER — METRONIDAZOLE 250 MG PO TABS: 250.0000 mg | ORAL_TABLET | Freq: Three times a day (TID) | ORAL | 0 refills | Status: AC

## 2021-08-17 NOTE — Progress Notes (Signed)
Wet mount reviewed, patient treated for BV per provider verbal orders.Burt Knack, RN

## 2021-08-17 NOTE — Telephone Encounter (Signed)
Attempt to contact patient to let her know of [facility] anatomy U/S 08/22/2021 at 3:00. No answer and LVM. Patient to arrive at 2:30 and with a full bladder.   Floy Sabina, RN

## 2021-08-17 NOTE — Addendum Note (Signed)
Addended by: Burt Knack on: 08/17/2021 04:57 PM   Modules accepted: Orders

## 2021-08-17 NOTE — Progress Notes (Signed)
[facility] Department Maternal Health Clinic  PRENATAL VISIT NOTE  Subjective:  Theresa Chase is a 25 y.o. G1P0000 at [redacted]w[redacted]d being seen today for ongoing prenatal care.  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  Patient reports external vaginal itching x few days.  Contractions: Not present. Vag. Bleeding: None.  Movement: Absent. Denies leaking of fluid/ROM.   The following portions of the patient's history were reviewed and updated as appropriate: allergies, current medications, past family history, past medical history, past social history, past surgical history and problem list. Problem list updated.  Objective:   Vitals:   08/17/21 1350  BP: (!) 105/59  Pulse: 69  Temp: 98.5 F (36.9 C)  Weight: 136 lb 6.4 oz (61.9 kg)    Fetal Status: Fetal Heart Rate (bpm): 160 Fundal Height: 18 cm Movement: Absent     General:  Alert, oriented and cooperative. Patient is in no acute distress.  Skin: Skin is warm and dry. No rash noted.   Cardiovascular: Normal heart rate noted  Respiratory: Normal respiratory effort, no problems with respiration noted  Abdomen: Soft, gravid, appropriate for gestational age.  Pain/Pressure: Absent     Pelvic: Cervical exam deferred        Extremities: Normal range of motion.  Edema: None  Mental Status: Normal mood and affect. Normal behavior. Normal judgment and thought content.   Assessment and Plan:  Pregnancy: G1P0000 at [redacted]w[redacted]d  1. Supervision of normal first pregnancy, antepartum Went to [place] 07/25/21-08/13/21 Quad screen today Anatomy u/s  ordered - AFP TETRA - Chlamydia/GC NAA, Confirmation - WET PREP FOR TRICH, YEAST, CLUE  2. Chlamydia trachomatis infection in pregnancy in first trimester Treated on 07/22/21; partner has not been treated yet. Last sex 07/18/21 without condom GC/Chlamydia TOC today. Please give contact card to pt for partner and encourage tx ASAP - Chlamydia/GC NAA, Confirmation  3. UTI in pregnancy, antepartum TOC 07/14/21=neg   Preterm labor symptoms and general obstetric precautions including but not limited to vaginal bleeding, contractions, leaking of fluid and fetal movement were reviewed in detail with the patient. Please refer to After Visit Summary for other counseling recommendations.  Return in about 4 weeks (around 09/14/2021) for routine PNC.  No future appointments.  Alberteen Spindle, CNM

## 2021-08-17 NOTE — Progress Notes (Addendum)
Patient here for MH RV at 16 3/7. Needs 3 week TOC for Chlamydia today and wants Quad screen.. States partner not yet treated, but they have not sex since her treatment.Marland KitchenMarland KitchenBurt Knack, RN

## 2021-08-19 ENCOUNTER — Telehealth: Payer: Self-pay

## 2021-08-19 LAB — AFP TETRA
DIA Mom Value: 1.22
DIA Value (EIA): 206.27 pg/mL
DSR (By Age)    1 IN: 1081
DSR (Second Trimester) 1 IN: 6766
Gestational Age: 16.3 WEEKS
MSAFP Mom: 1.07
MSAFP: 41.6 ng/mL
MSHCG Mom: 1.04
MSHCG: 46192 m[IU]/mL
Maternal Age At EDD: 23.7 yr
Osb Risk: 9862
T18 (By Age): 1:4212 {titer}
Test Results:: NEGATIVE
Weight: 136 [lb_av]
uE3 Mom: 1.47
uE3 Value: 1.56 ng/mL

## 2021-08-19 NOTE — Telephone Encounter (Signed)
Attempt to contact patient to let her know of [facility] anatomy U/S 08/22/2021 at 3:00. No answer and LVM. Patient to arrive at 2:30 and with a full bladder.  This is 2nd attempt to contact regarding appointment.   Called patient's emergency contact, [place], but there was no answer and LVM with above message.   Floy Sabina, RN

## 2021-08-22 ENCOUNTER — Ambulatory Visit
Admission: RE | Admit: 2021-08-22 | Discharge: 2021-08-22 | Disposition: A | Discharge status: Home / Self Care | Payer: Self-pay | Source: Ambulatory Visit

## 2021-08-22 ENCOUNTER — Other Ambulatory Visit: Payer: Self-pay

## 2021-08-22 ENCOUNTER — Ambulatory Visit
Admission: RE | Admit: 2021-08-22 | Discharge: 2021-08-22 | Disposition: A | Discharge status: Home / Self Care | Payer: Self-pay | Source: Ambulatory Visit | Attending: Advanced Practice Midwife | Admitting: Advanced Practice Midwife

## 2021-08-22 DIAGNOSIS — Z3686 Encounter for antenatal screening for cervical length: Secondary | ICD-10-CM | POA: Insufficient documentation

## 2021-08-22 DIAGNOSIS — Z3400 Encounter for supervision of normal first pregnancy, unspecified trimester: Secondary | ICD-10-CM

## 2021-08-22 LAB — CHLAMYDIA/GC NAA, CONFIRMATION
Chlamydia trachomatis, NAA: NEGATIVE
Neisseria gonorrhoeae, NAA: NEGATIVE

## 2021-08-23 ENCOUNTER — Encounter: Payer: Self-pay | Admitting: Advanced Practice Midwife

## 2021-08-23 ENCOUNTER — Other Ambulatory Visit: Payer: Self-pay | Admitting: Advanced Practice Midwife

## 2021-08-23 DIAGNOSIS — Z3686 Encounter for antenatal screening for cervical length: Secondary | ICD-10-CM

## 2021-08-23 DIAGNOSIS — Z3400 Encounter for supervision of normal first pregnancy, unspecified trimester: Secondary | ICD-10-CM

## 2021-08-23 DIAGNOSIS — O4400 Placenta previa specified as without hemorrhage, unspecified trimester: Secondary | ICD-10-CM | POA: Insufficient documentation

## 2021-08-23 HISTORY — DX: Complete placenta previa nos or without hemorrhage, unspecified trimester: O44.00

## 2021-08-24 ENCOUNTER — Telehealth: Payer: Self-pay

## 2021-08-24 NOTE — Telephone Encounter (Signed)
TC to [facility] U/S to inquire about discrepancy in [facility] between U/S on 05/28/21 and 08/22/21. Per Selena Batten in U/S, patient gave different LMPs at those two appointments and best option to determine [facility] would be to go with crown-rump length at the 05/28/21 U/S. EDC should be 01/19/2021 per Selena Batten based on crown-rump length. Selena Batten states she will have provider add addendum and fax the corrected U/S to [place].Burt Knack, RN

## 2021-09-15 ENCOUNTER — Other Ambulatory Visit: Payer: Self-pay

## 2021-09-15 ENCOUNTER — Ambulatory Visit: Payer: Self-pay | Admitting: Physician Assistant

## 2021-09-15 VITALS — BP 104/58 | HR 85 | Temp 97.1°F | Wt 141.8 lb

## 2021-09-15 DIAGNOSIS — O4400 Placenta previa specified as without hemorrhage, unspecified trimester: Secondary | ICD-10-CM

## 2021-09-15 DIAGNOSIS — Z3400 Encounter for supervision of normal first pregnancy, unspecified trimester: Secondary | ICD-10-CM

## 2021-09-15 NOTE — Progress Notes (Signed)
Patient here at 22w 0d for MH RV.   Flu vaccine given L deltoid tolerated well. NCIR given.   Floy Sabina, RN

## 2021-09-15 NOTE — Progress Notes (Signed)
[facility] Department Maternal Health Clinic  PRENATAL VISIT NOTE  Subjective:  Theresa Chase is a 25 y.o. G1P0000 at [redacted]w[redacted]d being seen today for ongoing [facility] Department Maternal Health Clinic  PRENATAL VISIT NOTE  Subjective:  Theresa Chase is a 25 y.o. G1P0000 at [redacted]w[redacted]d being seen today for ongoing prenatal care.  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  Patient reports  headache several days ago, resolved spontaneously .  Contractions: Not present. Vag. Bleeding: None.  Movement: Present. Denies leaking of fluid/ROM.   The following portions of the patient's history were reviewed and updated as appropriate: allergies, current medications, past family history, past medical history, past social history, past surgical history and problem list. Problem list updated.  Objective:   Vitals:   09/15/21 1313  BP: (!) 104/58  Pulse: 85  Temp: (!) 97.1 F (36.2 C)  Weight: 141 lb 12.8 oz (64.3 kg)    Fetal Status: Fetal Heart Rate (bpm): 140 Fundal Height: 22 cm Movement: Present     General:  Alert, oriented and cooperative. Patient is in no acute distress.  Skin: Skin is warm and dry. No rash noted.   Cardiovascular: Normal heart rate noted  Respiratory: Normal respiratory effort, no problems with respiration noted  Abdomen: Soft, gravid, appropriate for gestational age.  Pain/Pressure: Absent     Pelvic: Cervical exam deferred        Extremities: Normal range of motion.  Edema: None  Mental Status: Normal  mood and affect. Normal behavior. Normal judgment and thought content.   Assessment and Plan:  Pregnancy: G1P0000 at [redacted]w[redacted]d  1. Supervision of normal first pregnancy, antepartum Has had initial COVID vaccine series, counseled re: bivalent booster recommendation. Reviewed neg chlam TOC with plan for test for reinfection in Dec. - Flu Vaccine QUAD 45mo+IM (Fluarix, Fluzone & Alfiuria Quad PF)  2. Placenta previa antepartum Counseled pt re condition, rec avoid vigorous sexual activity, report vaginal bleeding ASAP. Plan repeat 5mo by 32 weeks.   Preterm labor symptoms and general obstetric precautions including but not limited to vaginal bleeding, contractions, leaking of fluid and fetal movement were reviewed in detail with the patient. Due to language barrier, an interpreter  [place] Judie Petit) was present during the history-taking and subsequent discussion (and for part of the physical exam) with this patient. Pt seen in partnership with [facility] resident [facility] MD.  Please refer to After Visit Summary for other counseling recommendations.  Return in about 4 weeks (around 10/13/2021) for Routine prenatal care.  Future Appointments  Date Time Provider Department Center  10/13/2021  8:20 AM AC-MH PROVIDER AC-MAT None    14/11/2020, PA-C

## 2021-10-13 ENCOUNTER — Ambulatory Visit: Payer: Self-pay | Admitting: Physician Assistant

## 2021-10-13 ENCOUNTER — Other Ambulatory Visit: Payer: Self-pay

## 2021-10-13 ENCOUNTER — Encounter: Payer: Self-pay | Admitting: Physician Assistant

## 2021-10-13 VITALS — BP 100/59 | HR 73 | Temp 97.7°F | Wt 149.4 lb

## 2021-10-13 DIAGNOSIS — O98311 Other infections with a predominantly sexual mode of transmission complicating pregnancy, first trimester: Secondary | ICD-10-CM

## 2021-10-13 DIAGNOSIS — Z3402 Encounter for supervision of normal first pregnancy, second trimester: Secondary | ICD-10-CM

## 2021-10-13 DIAGNOSIS — A568 Sexually transmitted chlamydial infection of other sites: Secondary | ICD-10-CM

## 2021-10-13 DIAGNOSIS — O4400 Placenta previa specified as without hemorrhage, unspecified trimester: Secondary | ICD-10-CM

## 2021-10-13 DIAGNOSIS — Z3400 Encounter for supervision of normal first pregnancy, unspecified trimester: Secondary | ICD-10-CM

## 2021-10-13 NOTE — Progress Notes (Signed)
Patient here for MH RV at 26 weeks. CMHRP and PHQ9 today. S/S PTL reviewed and literature given. Patient needs GC/Chlamydia TOC .Marland KitchenBurt Knack, RN

## 2021-10-13 NOTE — Progress Notes (Signed)
[facility] Health Department Maternal Health Clinic  PRENATAL VISIT NOTE  Subjective:  Theresa Chase is a 25 y.o. G1P0000 at [redacted]w[redacted]d being seen today for ongoing prenatal care.  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  Patient reports no complaints. Denies leaking of fluid/ROM.   The following portions of the patient's history were reviewed and updated as appropriate: allergies, current medications, past family history, past medical history, past social history, past surgical history and problem list. Problem list updated.  Objective:   Vitals:   10/13/21 0829  BP: (!) 100/59  Pulse: 73  Temp: 97.7 F (36.5 C)  Weight: 149 lb 6.4 oz (67.8 kg)    Fetal Status: Fetal Heart Rate (bpm): 140 Fundal Height: 26 cm Movement: Present     General:  Alert, oriented and cooperative. Patient is in no acute distress.  Skin: Skin is warm and dry. No rash noted.   Cardiovascular: Normal heart rate noted  Respiratory: Normal respiratory effort, no problems with respiration noted  Abdomen: Soft, gravid, appropriate for gestational age.  Pain/Pressure: Absent     Pelvic: Cervical exam deferred        Extremities: Normal range of motion.  Edema: None  Mental Status: Normal mood and affect. Normal behavior. Normal judgment and thought content.   Assessment and Plan:  Pregnancy: G1P0000 at [redacted]w[redacted]d  1. Supervision of normal first pregnancy, antepartum -26w pregnant woman seen today for a routine prenatal visit.  Patient states she is taking her  prenatal vitamins and Aspirin 81 MG daily.  No complaints today.  Signs and symptoms of preterm labor discussed with patient today.  Information sheet also provided. Patient will need an appointment in 2 weeks for 28 week labs.    2. Chlamydia trachomatis infection in pregnancy in first trimester -TOC today.  Patient positive for Chlamydia on 07/14/21.  Completed medication regimen.   - Chlamydia/GC NAA, Confirmation  3. Placenta previa antepartum - Patient had [facility] U/S on 08/22/21 and is due for U/S follow between 28-30 weeks.  MFM U/S referral completed today and ordered to be performed around 30 weeks.  Patient notified of referral.     Term labor symptoms and general obstetric precautions including but not limited to vaginal bleeding, contractions, leaking of fluid and fetal movement were reviewed in detail with the patient. Please refer to After Visit Summary for other counseling recommendations.   Due to language barrier, an interpreter was present during the provider portion of the visit.  10/22/21 used for interpretation.    Return in about 2 years (around 10/14/2023) for 28 week labs (before 10:30 or 3pm).  Future Appointments  Date Time Provider Department Center  10/26/2021  3:00 PM AC-MH PROVIDER AC-MAT None    10/28/2021, FNP

## 2021-10-14 ENCOUNTER — Telehealth: Payer: Self-pay

## 2021-10-14 NOTE — Telephone Encounter (Signed)
Call to [facility] and left message requesting [place] appt (referral faxed 10/13/2021). Number to call provided. Jossie Ng, RN

## 2021-10-15 LAB — CHLAMYDIA/GC NAA, CONFIRMATION
Chlamydia trachomatis, NAA: NEGATIVE
Neisseria gonorrhoeae, NAA: NEGATIVE

## 2021-10-17 ENCOUNTER — Telehealth: Payer: Self-pay

## 2021-10-17 NOTE — Telephone Encounter (Signed)
TC to patient to inform of [facility] U/S on 10/20/21 at 11:00. Patient needs to arrive at 10:45am. LM with number to call. [address] [place], [place] [id number].Marland KitchenBurt Knack, RN

## 2021-10-18 ENCOUNTER — Other Ambulatory Visit: Payer: Self-pay

## 2021-10-18 DIAGNOSIS — O4402 Placenta previa specified as without hemorrhage, second trimester: Secondary | ICD-10-CM

## 2021-10-18 NOTE — Telephone Encounter (Signed)
Client now has scheduled appt. Jossie Ng, RN

## 2021-10-18 NOTE — Telephone Encounter (Signed)
As unsuccessful attempt to contact client earlier this am, late pm call made with [facility] [employer]pre[employer] ID # [phone]. Left message to call regarding 10/20/2021 [place] appt and number tocall provided. Jossie Ng, RN

## 2021-10-18 NOTE — Telephone Encounter (Signed)
Call to client to notify her of [place] appt. Left message with appt date, time and requested she call us to verify aware of appt / facility location. Number to call provided. Call to emergency contact with above information and number to call provided. Jossie Ng, RN

## 2021-10-19 NOTE — Telephone Encounter (Signed)
As unsuccessful attempt to contact patient (call when straight to voicemail) regarding regarding 10/20/2021 [place] appt and number to call provided.   Call to emergency contact, Thayer Jew, to notify of above message. No answer and LVM.   Floy Sabina, RN

## 2021-10-20 ENCOUNTER — Ambulatory Visit: Payer: Self-pay | Attending: Obstetrics and Gynecology

## 2021-10-20 ENCOUNTER — Other Ambulatory Visit: Payer: Self-pay

## 2021-10-20 ENCOUNTER — Other Ambulatory Visit: Payer: Self-pay | Admitting: Nurse Practitioner

## 2021-10-20 VITALS — BP 109/76 | HR 81 | Temp 98.2°F | Wt 152.0 lb

## 2021-10-20 DIAGNOSIS — Z369 Encounter for antenatal screening, unspecified: Secondary | ICD-10-CM | POA: Insufficient documentation

## 2021-10-20 DIAGNOSIS — Z3A27 27 weeks gestation of pregnancy: Secondary | ICD-10-CM | POA: Insufficient documentation

## 2021-10-20 DIAGNOSIS — O4400 Placenta previa specified as without hemorrhage, unspecified trimester: Secondary | ICD-10-CM

## 2021-10-20 DIAGNOSIS — A568 Sexually transmitted chlamydial infection of other sites: Secondary | ICD-10-CM

## 2021-10-20 DIAGNOSIS — Z3400 Encounter for supervision of normal first pregnancy, unspecified trimester: Secondary | ICD-10-CM

## 2021-10-20 DIAGNOSIS — O2340 Unspecified infection of urinary tract in pregnancy, unspecified trimester: Secondary | ICD-10-CM

## 2021-10-20 DIAGNOSIS — Z3689 Encounter for other specified antenatal screening: Secondary | ICD-10-CM

## 2021-10-20 DIAGNOSIS — R7612 Nonspecific reaction to cell mediated immunity measurement of gamma interferon antigen response without active tuberculosis: Secondary | ICD-10-CM

## 2021-10-26 ENCOUNTER — Other Ambulatory Visit: Payer: Self-pay

## 2021-10-26 ENCOUNTER — Encounter: Payer: Self-pay | Admitting: Physician Assistant

## 2021-10-26 ENCOUNTER — Ambulatory Visit: Payer: Self-pay | Admitting: Physician Assistant

## 2021-10-26 VITALS — BP 108/60 | HR 79 | Temp 97.7°F | Wt 151.8 lb

## 2021-10-26 DIAGNOSIS — O4403 Placenta previa specified as without hemorrhage, third trimester: Secondary | ICD-10-CM

## 2021-10-26 DIAGNOSIS — Z23 Encounter for immunization: Secondary | ICD-10-CM

## 2021-10-26 DIAGNOSIS — Z3403 Encounter for supervision of normal first pregnancy, third trimester: Secondary | ICD-10-CM

## 2021-10-26 DIAGNOSIS — Z8669 Personal history of other diseases of the nervous system and sense organs: Secondary | ICD-10-CM

## 2021-10-26 DIAGNOSIS — Z3400 Encounter for supervision of normal first pregnancy, unspecified trimester: Secondary | ICD-10-CM

## 2021-10-26 DIAGNOSIS — O4400 Placenta previa specified as without hemorrhage, unspecified trimester: Secondary | ICD-10-CM

## 2021-10-26 LAB — HEMOGLOBIN, FINGERSTICK: Hemoglobin: 12.1 g/dL (ref 11.1–15.9)

## 2021-10-26 NOTE — Progress Notes (Signed)
[facility] Department Maternal Health Clinic  PRENATAL VISIT NOTE  Subjective:  Theresa Chase is a 25 y.o. G1P0000 at [redacted]w[redacted]d being seen today for ongoing prenatal care.  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  Patient reports no complaints.  Contractions: Not present. Vag. Bleeding: None.  Movement: Present. Denies leaking of fluid/ROM.   The following portions of the patient's history were reviewed and updated as appropriate: allergies, current medications, past family history, past medical history, past social history, past surgical history and problem list. Problem list updated.  Objective:  There were no vitals filed for this visit.  Fetal Status: Fetal Heart Rate (bpm): 152 Fundal Height: 28 cm Movement: Present     General:  Alert, oriented and cooperative. Patient is in no acute distress.  Skin: Skin is warm and dry. No rash noted.   Cardiovascular: Normal heart rate noted  Respiratory: Normal respiratory effort, no problems with respiration noted  Abdomen: Soft, gravid, appropriate for gestational age.  Pain/Pressure: Absent     Pelvic: Cervical exam deferred        Extremities: Normal range of motion.  Edema: None  Mental Status: Normal mood and affect. Normal behavior. Normal judgment and thought content.   Assessment and Plan:  Pregnancy: G1P0000 at [redacted]w[redacted]d  1. Supervision of normal first pregnancy, antepartum -25 year old female in clinic today for routine prenatal care visit. - 28 week labs completed  today. -Patient agrees to Tdap today. -Patient taking PNV daily.   -Patient taking Aspirin 81 mg PO daily. - HIV-1/HIV-2 Qualitative RNA - RPR - Glucose tolerance, 1 hour - Hemoglobin, fingerstick  2. Placenta previa antepartum -U/S 10/20/21, no placental abruption or previa identified.    3. Hx of migraine headaches -Assessed patient for migraines.  No migraines reported.  Will continue to monitor.     Term labor symptoms and general obstetric precautions including but not limited to vaginal bleeding, contractions, leaking of fluid and fetal movement were reviewed in detail with the patient. Please refer to After Visit Summary for other counseling recommendations.   Return in about 2 weeks (around 11/09/2021) for Routine prenatal care visit.    11/11/2021, FNP

## 2021-10-26 NOTE — Progress Notes (Signed)
Patient here for MH RV at 27 6/7. 28 week labs today, 1 hour gtt and Tdap. Had follow-up U/S on 10/20/21. Needs to be to lab by 4:10 for blood draw.Burt Knack, RN

## 2021-10-28 ENCOUNTER — Telehealth: Payer: Self-pay

## 2021-10-28 DIAGNOSIS — O99810 Abnormal glucose complicating pregnancy: Secondary | ICD-10-CM | POA: Insufficient documentation

## 2021-10-28 LAB — HIV-1/HIV-2 QUALITATIVE RNA
HIV-1 RNA, Qualitative: NONREACTIVE
HIV-2 RNA, Qualitative: NONREACTIVE

## 2021-10-28 LAB — RPR: RPR Ser Ql: NONREACTIVE

## 2021-10-28 LAB — GLUCOSE, 1 HOUR GESTATIONAL: Gestational Diabetes Screen: 147 mg/dL — ABNORMAL HIGH (ref 70–139)

## 2021-10-28 NOTE — Telephone Encounter (Signed)
Client counseled regarding need for 3 hour GTT and test prep instructions provided with verbalized understanding. Lane Hacker interpreted during phone call. Jossie Ng, RN

## 2021-10-28 NOTE — Telephone Encounter (Signed)
1 hour glucola = 147 and needs 3 hour GTT. Call to client with [facility] [employer]pre[employer] ID [phone] and left message to call regarding lab result and need for additional testing. Number to call provided. Jossie Ng, RN

## 2021-11-02 ENCOUNTER — Other Ambulatory Visit: Payer: Self-pay

## 2021-11-02 DIAGNOSIS — O99810 Abnormal glucose complicating pregnancy: Secondary | ICD-10-CM

## 2021-11-02 NOTE — Progress Notes (Signed)
Patient in Nurse Clinic for her 3 HR GTT.  She reports being NPO since 7:40 pm last night.  She is aware to remain NPO until the last lab draw and to stay on [facility] hallway and no burning calories.  She will report any N & V to nursing staff.  Next appointment is 11/09/2021.  Salli Real interpreted the visit. Hart Carwin, RN

## 2021-11-03 LAB — GLUCOSE TOLERANCE TEST, 6 HOUR
Glucose, 1 Hour GTT: 196 mg/dL (ref 70–199)
Glucose, 2 hour: 164 mg/dL — ABNORMAL HIGH (ref 70–139)
Glucose, 3 hour: 110 mg/dL — ABNORMAL HIGH (ref 70–109)
Glucose, GTT - Fasting: 76 mg/dL (ref 70–99)

## 2021-11-09 ENCOUNTER — Other Ambulatory Visit: Payer: Self-pay

## 2021-11-09 ENCOUNTER — Ambulatory Visit: Payer: Self-pay | Admitting: Family Medicine

## 2021-11-09 ENCOUNTER — Encounter: Payer: Self-pay | Admitting: Family Medicine

## 2021-11-09 VITALS — BP 107/56 | HR 80 | Temp 98.1°F | Wt 154.8 lb

## 2021-11-09 DIAGNOSIS — O24419 Gestational diabetes mellitus in pregnancy, unspecified control: Secondary | ICD-10-CM

## 2021-11-09 DIAGNOSIS — O24410 Gestational diabetes mellitus in pregnancy, diet controlled: Secondary | ICD-10-CM | POA: Insufficient documentation

## 2021-11-09 DIAGNOSIS — O0990 Supervision of high risk pregnancy, unspecified, unspecified trimester: Secondary | ICD-10-CM

## 2021-11-09 DIAGNOSIS — O0993 Supervision of high risk pregnancy, unspecified, third trimester: Secondary | ICD-10-CM

## 2021-11-09 HISTORY — DX: Gestational diabetes mellitus in pregnancy, diet controlled: O24.410

## 2021-11-09 HISTORY — DX: Gestational diabetes mellitus in pregnancy, unspecified control: O24.419

## 2021-11-09 MED ORDER — BLOOD GLUCOSE MONITOR KIT: PACK | 0 refills | Status: DC

## 2021-11-09 NOTE — Progress Notes (Signed)
[place] Department Maternal Health Clinic  PRENATAL VISIT NOTE  Subjective:  Theresa Chase is a 25 y.o. G1P0000 at 18w6dbeing seen today for ongoing prenatal care.  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  Patient reports no complaints.  Contractions: Not present. Vag. Bleeding: None.  Movement: Present. Denies leaking of fluid/ROM.   The following portions of the patient's history were reviewed and updated as appropriate: allergies, current medications, past family history, past medical history, past social history, past surgical history and problem list. Problem list updated.  Objective:   Vitals:   11/09/21 1414  BP: (!) 107/56  Pulse: 80  Temp: 98.1 F (36.7 C)  Weight: 154 lb 12.8 oz (70.2 kg)    Fetal Status: Fetal Heart Rate (bpm): 150 Fundal Height: 30 cm Movement: Present     General:  Alert, oriented and cooperative. Patient is in no acute distress.  Skin: Skin is warm and dry. No rash noted.   Cardiovascular: Normal heart rate noted  Respiratory: Normal respiratory effort, no problems with respiration noted  Abdomen: Soft, gravid, appropriate for gestational age.  Pain/Pressure: Absent     Pelvic: Cervical exam deferred        Extremities: Normal range of motion.  Edema: None  Mental Status: Normal mood and affect. Normal behavior. Normal judgment and thought content.   Assessment and Plan:  Pregnancy: G1P0000 at 278w6d1. Diet controlled gestational diabetes mellitus (GDM) in third  trimester Failed 3 hr GTT (76, 196, 164, 110- failed 1 and 2hr values) Counseled about diagnosis Reviewed plan of care  Provided basic GDM counseling Reviewed availability of OTC meter, lancets and strips given patient is self pay. Discussed that [place] the lowest prices for these OTC meters Reviewed need for [place] discussed I would order this today - blood glucose meter kit and supplies KIT; Dispense based on patient and insurance preference. Use up to four times daily as directed.  Dispense: 1 each; Refill: 0 - Referral to Nutrition and Diabetes Services - [place] OB FOLLOW UP; Future  2. Supervision of high risk pregnancy, antepartum See above  Interpretation provided by [place]Preterm labor symptoms and general obstetric precautions including but not limited to vaginal bleeding, contractions, leaking of fluid and fetal movement were reviewed in detail with the patient. Please refer to After Visit Summary for other counseling recommendations.  Return in about 2 weeks (around 11/23/2021) for Routine prenatal care.  Future Appointments  Date Time Provider DeNatalbany1/09/2022  3:00 PM AC-MH PROVIDER AC-MAT None    KiCaren MacadamMD

## 2021-11-09 NOTE — Progress Notes (Signed)
Patient here for MH RV at 29 6/7. Marland KitchenBurt Knack, RN

## 2021-11-10 ENCOUNTER — Telehealth: Payer: Self-pay

## 2021-11-10 NOTE — Telephone Encounter (Signed)
TC to patient to inform of [facility] U/S on 12/22/2021. Patient to arrive at 10:45 for 11:00 appt.. LM with number to call. Interpreter, Juliene Pina.Burt Knack, RN

## 2021-11-11 ENCOUNTER — Other Ambulatory Visit: Payer: Self-pay | Admitting: Family Medicine

## 2021-11-11 DIAGNOSIS — R7309 Other abnormal glucose: Secondary | ICD-10-CM

## 2021-11-13 NOTE — L&D Delivery Note (Signed)
Delivery Note ? ?Date of delivery: 01/20/2022 ?Estimated Date of Delivery: 01/19/22 ?Patient's last menstrual period was 04/13/2021 (exact date). ?EGA: [redacted]w[redacted]d ? ?Delivery Note ?At 8:23 PM a viable female was delivered via Vaginal, Spontaneous (Presentation: Right Occiput Anterior).  APGAR: 9, 9; weight pending.  Placenta status: Spontaneous, Intact.  Cord: 3 vessels with the following complications: None.  Cord pH: n/a ? ?First Stage: ?Labor onset: unknown ?Augmentation : Cytotec and Pitocin ?Analgesia /Anesthesia intrapartum: Epidural ?SROM at 1945 ? ?Theresa Chase presented to L&D for IOL for A1GDM. She was augmented with pitocin. Epidural placed for pain relief.  ? ?Second Stage: ?Complete dilation at 1946 ?Onset of pushing at 1956 ?FHR second stage Cat I and Cat II ?Delivery at 2023 on 01/20/2022 ? ?She progressed to complete and had a spontaneous vaginal birth of a live female over an intact perineum. The fetal head was delivered in OA position with restitution to ROA. no nuchal cord. Anterior then posterior shoulders delivered spontaneously. Baby placed on mom's abdomen and attended to by transition RN. Cord clamped and cut when pulseless by Pt's friend. Cord blood obtained for newborn labs. ? ?Third Stage: ?Placenta delivered intact with 3VC at 2030 ?Placenta disposition: routine disposal ?Uterine tone firm / bleeding min ?IV pitocin given for hemorrhage prophylaxis ? ?Anesthesia: Epidural ?Episiotomy: None ?Lacerations: Sulcus (right) ?Suture Repair: 2.0 vicryl ?Est. Blood Loss (mL):  ? ?Complications: none ? ?Mom to postpartum.  Baby to Couplet care / Skin to Skin. ? ?Newborn: ?Birth Weight: pending  ?Apgar Scores: 9, 9 ?Feeding planned: Breast and bottle ? ? ?Cyril Mourning, CNM ?01/20/2022 9:04 PM ?  ?

## 2021-11-17 NOTE — Addendum Note (Signed)
Addended by: Heywood Bene on: 11/17/2021 01:26 PM   Modules accepted: Orders

## 2021-11-18 NOTE — Telephone Encounter (Signed)
TC to patient to inform of [facility] U/S on 12/22/21. LM with number to call. Patient should also be expecting call from [facility]. Referral made on 11/09/21 and no appt scheduled as of today. Interpreter, M. [place].Burt Knack, RN

## 2021-11-18 NOTE — Telephone Encounter (Signed)
Return call from client and given [facility] [place] appt of 12/22/2021 with arrival time of 1045. Verified client aware of facility location. Client also aware to be expecting call from [facility] and states someone from [facility] has talked with her about this. States plans to pick up blood glucose testing supplies this weekend. Rich Number, RN

## 2021-11-23 ENCOUNTER — Other Ambulatory Visit: Payer: Self-pay

## 2021-11-23 ENCOUNTER — Ambulatory Visit: Payer: Medicaid Other | Admitting: Advanced Practice Midwife

## 2021-11-23 VITALS — BP 88/57 | HR 88 | Temp 97.4°F | Wt 155.8 lb

## 2021-11-23 DIAGNOSIS — O0993 Supervision of high risk pregnancy, unspecified, third trimester: Secondary | ICD-10-CM

## 2021-11-23 DIAGNOSIS — R7612 Nonspecific reaction to cell mediated immunity measurement of gamma interferon antigen response without active tuberculosis: Secondary | ICD-10-CM

## 2021-11-23 DIAGNOSIS — O24410 Gestational diabetes mellitus in pregnancy, diet controlled: Secondary | ICD-10-CM

## 2021-11-23 DIAGNOSIS — O0990 Supervision of high risk pregnancy, unspecified, unspecified trimester: Secondary | ICD-10-CM

## 2021-11-23 LAB — URINALYSIS
Bilirubin, UA: NEGATIVE
Glucose, UA: NEGATIVE
Ketones, UA: NEGATIVE
Leukocytes,UA: NEGATIVE
Nitrite, UA: NEGATIVE
Protein,UA: NEGATIVE
RBC, UA: NEGATIVE
Specific Gravity, UA: 1.015 (ref 1.005–1.030)
Urobilinogen, Ur: 0.2 mg/dL (ref 0.2–1.0)
pH, UA: 7.5 (ref 5.0–7.5)

## 2021-11-23 NOTE — Progress Notes (Signed)
[place] [facility]  PRENATAL VISIT NOTE  Subjective:  Theresa Chase is a 26 y.o. G1P0000 at [redacted]w[redacted]d being seen today for ongoing prenatal care.  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  Patient reports no complaints.  Contractions: Not present. Vag. Bleeding: None.  Movement: Present. Denies leaking of fluid/ROM.   The following portions of the patient's history were reviewed and updated as appropriate: allergies, current medications, past family history, past medical history, past social history, past surgical history and problem list. Problem list updated.  Objective:   Vitals:   11/23/21 1529  BP: (!) 88/57  Pulse: 88  Temp: (!) 97.4 F (36.3 C)  Weight: 155 lb 12.8 oz (70.7 kg)    Fetal Status: Fetal Heart Rate (bpm): 130 Fundal Height: 33 cm Movement: Present     General:  Alert, oriented and cooperative. Patient is in no acute distress.  Skin: Skin is warm and dry. No rash noted.   Cardiovascular: Normal heart rate noted  Respiratory: Normal respiratory effort, no problems with respiration noted  Abdomen: Soft, gravid, appropriate for gestational age.  Pain/Pressure: Absent     Pelvic: Cervical exam deferred        Extremities: Normal range of motion.  Edema: None  Mental Status: Normal mood and affect. Normal behavior. Normal judgment and thought content.   Assessment and Plan:  Pregnancy: G1P0000 at [redacted]w[redacted]d  1. Diet controlled gestational diabetes  mellitus (GDM) in third trimester 1 hour glucola=147 on 10/26/21 3 hour GTT on 11/02/21=gestational diabetic Pt has not had [facility] apt yet ([facility] called her 3x to schedule and she never answered or called her back). RN called [facility] today and scheduled first apt on 11/28/21--pt promises she will go. Pt counseled on high risk pregnancy now with gestational diabetes, need for weekly apts with u/a, exercise, keeping 2 [facility] apts, etc  Has growth u/s scheduled 12/22/21 U/a neg glucose, neg ketones   Exercise: not exercising--counseled needs to exercise 4-5x/wk  - Urinalysis (Urine Dip) - Amb ref to Medical Nutrition Therapy-MNT  2. 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 Supervision of high risk pregnancy, antepartum Working 50 hrs/wk. Living with her uncle   Preterm labor symptoms and general obstetric precautions including but not limited to vaginal bleeding, contractions, leaking of fluid and fetal movement were reviewed in detail with the patient. Please refer to After Visit Summary for other counseling recommendations.  Return in about 1 week (around 11/30/2021) for routine PNC.  Future Appointments  Date Time Provider [place]  11/28/2021  1:30 PM Shotwell, Guadalupe Maple, RN [facility] None  12/05/2021  3:40 PM [facility] PROVIDER [facility] None  12/22/2021 11:00 AM [facility] US1 [facility] [place], CNM

## 2021-11-23 NOTE — Progress Notes (Signed)
Urine dip reviewed during clinic visit. Patient did not bring blood sugar log, she stated she did not know to bring.   Per [facility] appointment notes - they have attempted to contact patient x3 times (on 11/09/21, 11/15/21, and 11/23/21) they have left VM and patient has not returned their call. [facility] appointment for [place] office is [phone].     Adalberto Cole, RN

## 2021-11-28 ENCOUNTER — Encounter: Payer: Medicaid Other | Attending: Advanced Practice Midwife | Admitting: *Deleted

## 2021-11-28 ENCOUNTER — Encounter: Payer: Self-pay | Admitting: *Deleted

## 2021-11-28 ENCOUNTER — Other Ambulatory Visit: Payer: Self-pay

## 2021-11-28 ENCOUNTER — Ambulatory Visit: Payer: Self-pay | Admitting: Nutrition

## 2021-11-28 VITALS — BP 90/64 | Ht 65.0 in | Wt 159.9 lb

## 2021-11-28 DIAGNOSIS — O24410 Gestational diabetes mellitus in pregnancy, diet controlled: Secondary | ICD-10-CM | POA: Diagnosis present

## 2021-11-28 DIAGNOSIS — Z3A31 31 weeks gestation of pregnancy: Secondary | ICD-10-CM | POA: Diagnosis not present

## 2021-11-28 NOTE — Progress Notes (Signed)
Diabetes Self-Management Education  Visit Type: First/Initial  Appt. Start Time: 1330 Appt. End Time: 1500  11/28/2021  [name], identified by name and date of birth, is a 26 y.o. female with a diagnosis of Diabetes: Gestational Diabetes.   ASSESSMENT  Blood pressure 90/64, height 5\' 5"  (1.651 m), weight 159 lb 14.4 oz (72.5 kg), last menstrual period 04/13/2021, estimated date of delivery 01/19/2022 Body mass index is 26.61 kg/m.   Diabetes Self-Management Education - 11/28/21 1534       Visit Information   Visit Type First/Initial      Initial Visit   Diabetes Type Gestational Diabetes    Are you currently following a meal plan? Yes    What type of meal plan do you follow? "less fast foods"    Are you taking your medications as prescribed? Yes    Date Diagnosed 1 month ago      Health Coping   How would you rate your overall health? Good      Psychosocial Assessment   Patient Belief/Attitude about Diabetes Other (comment)   "normal"   Self-care barriers English as a second language    Self-management support Doctor's office    Other persons present Interpreter   11/30/21   Patient Concerns Nutrition/Meal planning;Healthy Lifestyle    Special Needs Other (comment)   Information in Spanish   Preferred Learning Style Other (comment)   talking/discussion   Learning Readiness Ready    How often do you need to have someone help you when you read instructions, pamphlets, or other written materials from your doctor or pharmacy? 5 - Always   unless written in Spanish - pt completed her paper work that was in Spanish   What is the last grade level you completed in school? high school      Pre-Education Assessment   Patient understands the diabetes disease and treatment process. Needs Instruction    Patient understands incorporating nutritional management into lifestyle. Needs Instruction    Patient undertands incorporating physical activity into lifestyle. Needs  Instruction    Patient understands using medications safely. Needs Instruction    Patient understands monitoring blood glucose, interpreting and using results Needs Instruction    Patient understands prevention, detection, and treatment of acute complications. Needs Instruction    Patient understands prevention, detection, and treatment of chronic complications. Needs Instruction    Patient understands how to develop strategies to address psychosocial issues. Needs Instruction    Patient understands how to develop strategies to promote health/change behavior. Needs Instruction      Complications   How often do you check your blood sugar? 0 times/day (not testing)   Pt brought her meter from home (FreeStyle Lite) with no strips. She required instruction which was provided. BG in the office on our meter was 97 mg/dL at [facility] ppm - 3 1/2 hrs pp.   Have you had a dilated eye exam in the past 12 months? No    Have you had a dental exam in the past 12 months? No    Are you checking your feet? Yes    How many days per week are you checking your feet? 7      Dietary Intake   Breakfast white wheat bread and milk    Snack (morning) apple, banana, peanut butter crackers    Lunch rice with chicken and sauce; steak with veggies - lettuce, cuccumbers, tomatoes, carrots; chicken soup; beans and lentils; broccoli, onions, peppers, squash    Snack (  afternoon) mango, nuts - almonds, peanuts    Dinner milk with cereal ; egg    Beverage(s) water, juice, sweet tea      Exercise   Exercise Type ADL's   reports walking 60 minutes daily - at work     Patient Education   Previous Diabetes Education No    Disease state  Definition of diabetes, type 1 and 2, and the diagnosis of diabetes;Factors that contribute to the development of diabetes    Nutrition management  Role of diet in the treatment of diabetes and the relationship between the three main macronutrients and blood glucose level;Food label reading,  portion sizes and measuring food.;Reviewed blood glucose goals for pre and post meals and how to evaluate the patients' food intake on their blood glucose level.    Physical activity and exercise  Role of exercise on diabetes management, blood pressure control and cardiac health.    Medications Other (comment)   Limited use of oral medications during pregnancy and potential for insulin.   Monitoring Taught/evaluated SMBG meter.;Purpose and frequency of SMBG.;Taught/discussed recording of test results and interpretation of SMBG.    Chronic complications Relationship between chronic complications and blood glucose control    Psychosocial adjustment Identified and addressed patients feelings and concerns about diabetes    Preconception care Pregnancy and GDM  Role of pre-pregnancy blood glucose control on the development of the fetus;Reviewed with patient blood glucose goals with pregnancy;Role of family planning for patients with diabetes      Individualized Goals (developed by patient)   Reducing Risk Other (comment)   lead a healthy lifestyle     Outcomes   Expected Outcomes Demonstrated interest in learning. Expect positive outcomes    Program Status Not Completed        Individualized Plan for Diabetes Self-Management Training:   Learning Objective:  Patient will have a greater understanding of diabetes self-management. Patient education plan is to attend individual and/or group sessions per assessed needs and concerns.   Plan:  Read booklet on Gestational Diabetes Follow Gestational Meal Planning Guidelines Include 1 serving of protein with each meal Avoid sugar sweetened drinks (juice, tea) Check blood sugars 4 x day - before breakfast and 2 hrs after every meal and record  Bring blood sugar log to all appointments Obtain strips at your pharmacy for your home meter - FreeStyle Lite  Walk 20-30 minutes at least 5 x week if permitted by MD Call back if you want an appointment with  the dietitian  Expected Outcomes:  Demonstrated interest in learning. Expect positive outcomes  Education material provided:  Gestational Booklet (Spanish) Gestational Meal Planning Guidelines (Spanish) Simple Meal Plan (Spanish) Goals for a Healthy Pregnancy (Spanish)   If problems or questions, patient to contact team via:   Theresa Settler, RN, CCM, CDCES [phone]  Future DSME appointment: PRN The patient doesn't have insurance and can't afford to pay for her next appointment with the dietitian.

## 2021-11-28 NOTE — Patient Instructions (Signed)
Read booklet on Gestational Diabetes Follow Gestational Meal Planning Guidelines Include 1 serving of protein with each meal Avoid sugar sweetened drinks (juice, tea) Check blood sugars 4 x day - before breakfast and 2 hrs after every meal and record  Bring blood sugar log to all appointments Obtain strips at your pharmacy for your home meter - FreeStyle Lite  Walk 20-30 minutes at least 5 x week if permitted by MD Call back if you want an appointment with the dietitian

## 2021-12-05 ENCOUNTER — Ambulatory Visit: Payer: Medicaid Other | Admitting: Advanced Practice Midwife

## 2021-12-05 ENCOUNTER — Telehealth: Payer: Self-pay

## 2021-12-05 ENCOUNTER — Other Ambulatory Visit: Payer: Self-pay

## 2021-12-05 VITALS — BP 84/47 | HR 81 | Temp 97.9°F | Wt 162.4 lb

## 2021-12-05 DIAGNOSIS — Z3400 Encounter for supervision of normal first pregnancy, unspecified trimester: Secondary | ICD-10-CM

## 2021-12-05 DIAGNOSIS — R7612 Nonspecific reaction to cell mediated immunity measurement of gamma interferon antigen response without active tuberculosis: Secondary | ICD-10-CM

## 2021-12-05 DIAGNOSIS — O24410 Gestational diabetes mellitus in pregnancy, diet controlled: Secondary | ICD-10-CM

## 2021-12-05 DIAGNOSIS — A568 Sexually transmitted chlamydial infection of other sites: Secondary | ICD-10-CM

## 2021-12-05 DIAGNOSIS — O0990 Supervision of high risk pregnancy, unspecified, unspecified trimester: Secondary | ICD-10-CM

## 2021-12-05 DIAGNOSIS — O0993 Supervision of high risk pregnancy, unspecified, third trimester: Secondary | ICD-10-CM

## 2021-12-05 DIAGNOSIS — O98311 Other infections with a predominantly sexual mode of transmission complicating pregnancy, first trimester: Secondary | ICD-10-CM

## 2021-12-05 LAB — URINALYSIS
Bilirubin, UA: NEGATIVE
Glucose, UA: NEGATIVE
Ketones, UA: NEGATIVE
Nitrite, UA: NEGATIVE
Protein,UA: NEGATIVE
Specific Gravity, UA: 1.015 (ref 1.005–1.030)
Urobilinogen, Ur: 1 mg/dL (ref 0.2–1.0)
pH, UA: 7 (ref 5.0–7.5)

## 2021-12-05 NOTE — Progress Notes (Signed)
[facility]  PRENATAL VISIT NOTE  Subjective:  Theresa Chase is a 26 y.o. G1P0000 at [redacted]w[redacted]d being seen today for ongoing prenatal care.  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  Patient reports no complaints.   .  .  Movement: Present. Denies leaking of fluid/ROM.   The following portions of the patient's history were reviewed and updated as appropriate: allergies, current medications, past family history, past medical history, past social history, past surgical history and problem list. Problem list updated.  Objective:   Vitals:   12/05/21 1610  BP: (!) 84/47  Pulse: 81  Temp: 97.9 F (36.6 C)  Weight: 162 lb 6.4 oz (73.7 kg)    Fetal Status: Fetal Heart Rate (bpm): 140 Fundal Height: 34 cm Movement: Present     General:  Alert, oriented and cooperative. Patient is in no acute distress.  Skin: Skin is warm and dry. No rash noted.   Cardiovascular: Normal heart rate noted  Respiratory: Normal respiratory effort, no problems with respiration noted  Abdomen: Soft, gravid, appropriate for gestational age.  Pain/Pressure: Absent     Pelvic: Cervical exam deferred        Extremities: Normal range of motion.  Edema: None  Mental Status: Normal mood and affect. Normal behavior. Normal judgment and thought content.   Assessment and Plan:  Pregnancy: G1P0000 at [redacted]w[redacted]d  - Urinalysis (Urine Dip)  1. Diet controlled gestational diabetes mellitus (GDM) in third  trimester dx'd 11/02/21 Blood sugar log values below. Encouraged continued daily exercise and diet modifications. 0 of  8 FBS values are abnormal (range was 76 to 86) 4 of 23 2hr pp values are abnormal (range was 80 to 141) Exercise: none  Diet recall :   Breakfast =1 burrito (egg, tomato, onion, peppers, cheese), 1 c. coffee                        Snack=1/2 banana, water                      Lunch = beans, chicken, salsa, water                      Dinner =4 peanut butter crackers, water                      Snack = potato chips, water Water: drinks about  5 bottles of water/day and night  Urine today: neg glucose, neg ketones, neg protein Kept [facility] apt on 11/28/21 but refused second apt with RD [facility] u/s MFM 12/22/21  2. Supervision of high risk pregnancy, antepartum Pt has not been counseled re: LTBI tx. Tb coordinator contacted by RN who states pt lives in [place] so they will need to be contacted 32 lb 6.4 oz (14.7 kg) 7 lb wt gain in past 2 wks--not exercising and counseled to do so  Working 50 hrs/wk and living with her uncle  3. Chlamydia trachomatis infection in pregnancy in first trimester 07/14/21 10/13/21 test for reinfection=neg  Preterm labor symptoms and general obstetric precautions including but not limited to vaginal bleeding, contractions, leaking of fluid and fetal movement were reviewed in detail with the patient. Please refer to After Visit Summary for other counseling recommendations.  Return in about 10 days (around 12/15/2021) for routine PNC.  Future Appointments  Date Time Provider [place]  12/22/2021 11:00 AM [facility] [place], CNM

## 2021-12-05 NOTE — Telephone Encounter (Signed)
+  QFT, EPI and CXR faxed to [facility] for LTBI tx.  Patient reported interested in LTBI tx at todays visit.  Patient provided # to [facility]. Richmond Campbell, RN

## 2021-12-05 NOTE — Progress Notes (Signed)
Urine results reviewed by provider E. Sciora, CNM. No orders given. Ayana Imhof, RN ° °

## 2021-12-05 NOTE — Progress Notes (Addendum)
Here today for 33.4 week MH RV. Taking PNV QD. Denies ED/hospital visits since last RV. Copy BS log and UA today. Kick count cards given and explained today. Kept 11/28/2021 [facility] appt. Aware of 12/22/2021 [facility] ultrasound appt. Undecided regarding pediatrician choice. Tawny Hopping, RN

## 2021-12-14 ENCOUNTER — Ambulatory Visit: Payer: Medicaid Other | Admitting: Advanced Practice Midwife

## 2021-12-14 ENCOUNTER — Other Ambulatory Visit: Payer: Self-pay

## 2021-12-14 VITALS — BP 109/60 | HR 75 | Temp 97.6°F | Wt 158.4 lb

## 2021-12-14 DIAGNOSIS — R7612 Nonspecific reaction to cell mediated immunity measurement of gamma interferon antigen response without active tuberculosis: Secondary | ICD-10-CM

## 2021-12-14 DIAGNOSIS — O98311 Other infections with a predominantly sexual mode of transmission complicating pregnancy, first trimester: Secondary | ICD-10-CM

## 2021-12-14 DIAGNOSIS — O0993 Supervision of high risk pregnancy, unspecified, third trimester: Secondary | ICD-10-CM

## 2021-12-14 DIAGNOSIS — O0990 Supervision of high risk pregnancy, unspecified, unspecified trimester: Secondary | ICD-10-CM

## 2021-12-14 DIAGNOSIS — A568 Sexually transmitted chlamydial infection of other sites: Secondary | ICD-10-CM

## 2021-12-14 DIAGNOSIS — Z3400 Encounter for supervision of normal first pregnancy, unspecified trimester: Secondary | ICD-10-CM

## 2021-12-14 DIAGNOSIS — O24410 Gestational diabetes mellitus in pregnancy, diet controlled: Secondary | ICD-10-CM

## 2021-12-14 LAB — URINALYSIS
Bilirubin, UA: NEGATIVE
Glucose, UA: NEGATIVE
Ketones, UA: NEGATIVE
Leukocytes,UA: NEGATIVE
Nitrite, UA: NEGATIVE
Protein,UA: NEGATIVE
RBC, UA: NEGATIVE
Specific Gravity, UA: 1.01 (ref 1.005–1.030)
Urobilinogen, Ur: 0.2 mg/dL (ref 0.2–1.0)
pH, UA: 7 (ref 5.0–7.5)

## 2021-12-14 NOTE — Progress Notes (Signed)
Patient here for MH RV at 34 6/7. BS log copied and needs urine dip today. States she has not talked with anyone at [facility] about LBTI .Marland KitchenMarland KitchenBurt Knack, RN

## 2021-12-14 NOTE — Progress Notes (Signed)
[place] [facility]  PRENATAL VISIT NOTE  Subjective:  Theresa Chase is a 26 y.o. G1P0000 at [redacted]w[redacted]d being seen today for ongoing prenatal care.  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  Patient reports no complaints.   .  .  Movement: Present. Denies leaking of fluid/ROM.   The following portions of the patient's history were reviewed and updated as appropriate: allergies, current medications, past family history, past medical history, past social history, past surgical history and problem list. Problem list updated.  Objective:   Vitals:   12/14/21 1351  BP: 109/60  Pulse: 75  Temp: 97.6 F (36.4 C)  Weight: 158 lb 6.4 oz (71.8 kg)    Fetal Status: Fetal Heart Rate (bpm): 150 Fundal Height: 35 cm Movement: Present     General:  Alert, oriented and cooperative. Patient is in no acute distress.  Skin: Skin is warm and dry. No rash noted.   Cardiovascular: Normal heart rate noted  Respiratory: Normal respiratory effort, no problems with respiration noted  Abdomen: Soft, gravid, appropriate for gestational age.        Pelvic: Cervical exam deferred        Extremities: Normal range of motion.  Edema: None  Mental Status: Normal mood and affect. Normal behavior. Normal judgment and thought content.   Assessment and Plan:  Pregnancy: G1P0000 at [redacted]w[redacted]d  - Urinalysis  2. Diet controlled gestational diabetes mellitus (GDM) in third trimester Blood sugar log values  below. Encouraged continued daily exercise and diet modifications. 0 of  10 FBS values are abnormal (range was 69 to 94) 1 of 28 2hr pp values are abnormal (range was 77 to 128) Exercise: walks 10. Minutes 3x/wk Diet recall :   Breakfast = 1/2 avocado sandwich on 1 piece of bread with lemon, water                      Lunch = chicken, avocado, tomato, onion, 1 tortilla, water                      Dinner = chicken, tomatoe, onion, 1/2 avocado, 1 tortilla, water, 1 orange                      Snack = 1 kiwi Water: drinks about  5-6 bottles of water/day and night  U/a=neg glucose, neg protein, neg ketones - Urinalysis  3. Supervision of high risk pregnancy, antepartum Working 50 hrs/wk and requesting note to work no more than 40 hours/wk--done 28 lb 6.4 oz (12.9 kg) 4 lb wt loss in last 1 week  4. 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 Galen Manila, RN states she contacted [employer] to counsel pt and treat LTBI but pt states she hasn't received a phone call yet. Pt given # to call [facility] health dept herself. RN to contact Galen Manila, RN again here.   5. Chlamydia trachomatis infection in pregnancy in first trimester 07/14/21  3 month test for reinfection 10/13/21=neg   Preterm labor symptoms and general obstetric precautions including but not limited to vaginal bleeding, contractions, leaking of fluid and fetal movement were reviewed in detail with the patient. Please refer to After Visit Summary for other counseling recommendations.  Return in about 8 days (around 12/22/2021).  Future Appointments  Date Time Provider [place]  12/22/2021 11:00 AM [facility] [place], CNM

## 2021-12-22 ENCOUNTER — Ambulatory Visit: Payer: Medicaid Other | Admitting: Physician Assistant

## 2021-12-22 ENCOUNTER — Other Ambulatory Visit: Payer: Self-pay

## 2021-12-22 ENCOUNTER — Ambulatory Visit: Payer: Medicaid Other | Attending: Obstetrics

## 2021-12-22 ENCOUNTER — Encounter: Payer: Self-pay | Admitting: Family Medicine

## 2021-12-22 ENCOUNTER — Encounter: Payer: Self-pay | Admitting: Physician Assistant

## 2021-12-22 VITALS — BP 79/32 | HR 79 | Temp 97.7°F | Wt 161.2 lb

## 2021-12-22 VITALS — BP 108/65 | HR 75 | Temp 97.9°F | Ht 65.0 in | Wt 162.5 lb

## 2021-12-22 DIAGNOSIS — O2340 Unspecified infection of urinary tract in pregnancy, unspecified trimester: Secondary | ICD-10-CM

## 2021-12-22 DIAGNOSIS — O0993 Supervision of high risk pregnancy, unspecified, third trimester: Secondary | ICD-10-CM | POA: Diagnosis not present

## 2021-12-22 DIAGNOSIS — O24410 Gestational diabetes mellitus in pregnancy, diet controlled: Secondary | ICD-10-CM

## 2021-12-22 DIAGNOSIS — O98311 Other infections with a predominantly sexual mode of transmission complicating pregnancy, first trimester: Secondary | ICD-10-CM

## 2021-12-22 DIAGNOSIS — Z369 Encounter for antenatal screening, unspecified: Secondary | ICD-10-CM | POA: Diagnosis not present

## 2021-12-22 DIAGNOSIS — Z3A36 36 weeks gestation of pregnancy: Secondary | ICD-10-CM | POA: Insufficient documentation

## 2021-12-22 DIAGNOSIS — A568 Sexually transmitted chlamydial infection of other sites: Secondary | ICD-10-CM

## 2021-12-22 DIAGNOSIS — R7612 Nonspecific reaction to cell mediated immunity measurement of gamma interferon antigen response without active tuberculosis: Secondary | ICD-10-CM

## 2021-12-22 DIAGNOSIS — O0990 Supervision of high risk pregnancy, unspecified, unspecified trimester: Secondary | ICD-10-CM

## 2021-12-22 LAB — URINALYSIS
Bilirubin, UA: NEGATIVE
Glucose, UA: NEGATIVE
Ketones, UA: NEGATIVE
Leukocytes,UA: NEGATIVE
Nitrite, UA: NEGATIVE
Protein,UA: NEGATIVE
RBC, UA: NEGATIVE
Specific Gravity, UA: 1.025 (ref 1.005–1.030)
Urobilinogen, Ur: 0.2 mg/dL (ref 0.2–1.0)
pH, UA: 7 (ref 5.0–7.5)

## 2021-12-22 NOTE — Progress Notes (Signed)
[place] [facility]  PRENATAL VISIT NOTE  Subjective:  Theresa Chase is a 26 y.o. G1P0000 at [redacted]w[redacted]d being seen today for ongoing prenatal care.  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  Patient reports no complaints.  Contractions: Not present. Vag. Bleeding: None.  Movement: Present. Denies leaking of fluid/ROM.   The following portions of the patient's history were reviewed and updated as appropriate: allergies, current medications, past family history, past medical history, past social history, past surgical history and problem list. Problem list updated.  Objective:   Vitals:   12/22/21 1443  BP: (!) 79/32  Pulse: 79  Temp: 97.7 F (36.5 C)  Weight: 161 lb 3.2 oz (73.1 kg)    Fetal Status: Fetal Heart Rate (bpm): 164 Fundal Height: 36 cm Movement: Present  Presentation: Vertex  General:  Alert, oriented and cooperative. Patient is in no acute distress.  Skin: Skin is warm and dry. No rash noted.   Cardiovascular: Normal heart rate noted  Respiratory: Normal respiratory effort, no problems with respiration noted  Abdomen: Soft, gravid, appropriate for gestational age.  Pain/Pressure: Absent     Pelvic: Cervical exam deferred        Extremities: Normal range of motion.  Edema: Other (Comment)  Mental Status: Normal mood and affect. Normal behavior. Normal judgment and thought content.   Assessment and Plan:  Pregnancy:  G1P0000 at [redacted]w[redacted]d  1. Supervision of high risk pregnancy, antepartum Routine rectovaginal testing today. Transition to weekly prenatal visits. Anticipatory guidance re: procedure for hospital entry in labor. PHQ-9 score = 0 today. - GBS Culture - Chlamydia/GC NAA, Confirmation - Urinalysis (Urine Dip)  2. Diet controlled gestational diabetes mellitus (GDM) in third trimester Reviewed BS log with all readings in desired range. Continue diet/activity and home BS checks, doing well without diabetes meds. - Urinalysis (Urine Dip)   Preterm labor symptoms and general obstetric precautions including but not limited to vaginal bleeding, contractions, leaking of fluid and fetal movement were reviewed in detail with the patient. Due to language barrier, an interpreter [place]) was present during the provider portion of the visit with this patient.  Please refer to After Visit Summary for other counseling recommendations.  Return in about 1 week (around 12/29/2021) for Routine prenatal care.  Future Appointments  Date Time Provider [place]  12/28/2021  1:40 PM [facility] PROVIDER [facility] None    Lora Havens, PA-C

## 2021-12-22 NOTE — Progress Notes (Addendum)
Here today for 36.0 week MH RV. Taking PNV QD. More PNV's given. Denies ED/hospital visits since last RV. Kept scheduled MFM [place] appt today. Self collecting 36 week labs today. 36 week packet given. Tawny Hopping, RN

## 2021-12-26 ENCOUNTER — Encounter: Payer: Self-pay | Admitting: Family Medicine

## 2021-12-26 NOTE — Progress Notes (Unsigned)
Patient ID: Theresa Chase, female   DOB: [birth year], 26 y.o.   MRN: [id number] [facility] Department Maternity Care Conference  Maternity Care Conference Date: 12/26/21  Theresa Chase was identified by clinical staff to benefit from an interdisciplinary team approach to help improve pregnancy care.  The [facility] Maternity Care Conference includes the maternity clinic coordinator (RN), medical providers (MD/APP staff), Care Management -OBCM and Healthy Beginnings, Centering Pregnancy coordinator, Infant Mortality reduction Dietitian.  Nursing staff are also encouraged to participate. The group meets monthly to discuss patient care and coordinate services.   The patient's care care at the agency was reviewed in EMR and high risk factors evaluated in an interdisciplinary approach.    Value added interventions discussed at this care conference today were:   [facility] Meeting 2/9 Diabetes in pregnancy  Overall patient is doing well  Ultrasound was completed 2/9  Has appt. 2/9 with provider AFS  L.Synetta Fail

## 2021-12-27 LAB — CHLAMYDIA/GC NAA, CONFIRMATION
Chlamydia trachomatis, NAA: NEGATIVE
Neisseria gonorrhoeae, NAA: NEGATIVE

## 2021-12-27 LAB — CULTURE, BETA STREP (GROUP B ONLY): Strep Gp B Culture: NEGATIVE

## 2021-12-28 ENCOUNTER — Other Ambulatory Visit: Payer: Self-pay

## 2021-12-28 ENCOUNTER — Ambulatory Visit: Payer: Medicaid Other | Admitting: Advanced Practice Midwife

## 2021-12-28 VITALS — BP 102/63 | HR 79 | Temp 97.7°F | Wt 160.4 lb

## 2021-12-28 DIAGNOSIS — O0990 Supervision of high risk pregnancy, unspecified, unspecified trimester: Secondary | ICD-10-CM

## 2021-12-28 DIAGNOSIS — O24410 Gestational diabetes mellitus in pregnancy, diet controlled: Secondary | ICD-10-CM

## 2021-12-28 LAB — URINALYSIS
Bilirubin, UA: NEGATIVE
Glucose, UA: NEGATIVE
Ketones, UA: NEGATIVE
Nitrite, UA: NEGATIVE
Protein,UA: NEGATIVE
RBC, UA: NEGATIVE
Specific Gravity, UA: 1.01 (ref 1.005–1.030)
Urobilinogen, Ur: 0.2 mg/dL (ref 0.2–1.0)
pH, UA: 6.5 (ref 5.0–7.5)

## 2021-12-28 NOTE — Progress Notes (Signed)
[place] Department [facility]  PRENATAL VISIT NOTE  Subjective:  Theresa Chase is a 26 y.o. G1P0000 at [redacted]w[redacted]d being seen today for ongoing prenatal care.  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  Patient reports no complaints.  Contractions: Not present. Vag. Bleeding: None.  Movement: Present. Denies leaking of fluid/ROM.   The following portions of the patient's history were reviewed and updated as appropriate: allergies, current medications, past family history, past medical history, past social history, past surgical history and problem list. Problem list updated.  Objective:   Vitals:   12/28/21 1356  BP: 102/63  Pulse: 79  Temp: 97.7 F (36.5 C)  Weight: 160 lb 6.4 oz (72.8 kg)    Fetal Status: Fetal Heart Rate (bpm): 150 Fundal Height: 36 cm Movement: Present  Presentation: Vertex  General:  Alert, oriented and cooperative. Patient is in no acute distress.  Skin: Skin is warm and dry. No rash noted.   Cardiovascular: Normal heart rate noted  Respiratory: Normal respiratory effort, no problems with respiration noted  Abdomen: Soft, gravid, appropriate for gestational age.  Pain/Pressure: Absent     Pelvic: Cervical exam deferred        Extremities: Normal range of motion.  Edema: None  Mental Status: Normal mood and affect. Normal behavior. Normal judgment and thought content.   Assessment and Plan:  Pregnancy:  G1P0000 at [redacted]w[redacted]d  1. Supervision of high risk pregnancy, antepartum Knows when to go to L&D 30 lb 6.4 oz (13.8 kg) Has car seat and ready for baby at home GC/Chlamydia/GBS neg on 12/22/21 - Urinalysis (Urine Dip)  2. Diet controlled gestational diabetes mellitus (GDM) in third trimester dx'd 11/02/21 Blood sugar log values below. Encouraged continued daily exercise and diet modifications. 0 of  5 FBS values are abnormal (range was 80 to 85) 0 of 13 2hr pp values are abnormal (range was 82 to 105) Exercise: walks 10. minutes 3x/wk Diet recall :   Breakfast = 1 burrito (steak, guacamole, beans), water                      Lunch = noodles, chicken, carrots, peppers, water                      Dinner = 2 burritos (steak, beans, guacamole), 2 bottles water                      Snack = 1 banana Water: drinks about  6 bottles of water/day and night  Urine: neg glucose, neg ketones  Preterm labor symptoms and general obstetric precautions including but not limited to vaginal bleeding, contractions, leaking of fluid and fetal movement were reviewed in detail with the patient. Please refer to After Visit Summary for other counseling recommendations.  Return in about 8 days (around 01/05/2022) for routine PNC.  No future appointments.  Herbie Saxon, CNM

## 2021-12-28 NOTE — Progress Notes (Signed)
Here today for 36.6 week MH RV. Taking PNV QD. Denies ED/hospital visits since last RV. UA today and blood sugar log copied. Tawny Hopping, RN

## 2022-01-04 ENCOUNTER — Other Ambulatory Visit: Payer: Self-pay

## 2022-01-04 ENCOUNTER — Ambulatory Visit: Payer: Medicaid Other | Admitting: Advanced Practice Midwife

## 2022-01-04 VITALS — BP 90/53 | HR 80 | Temp 98.2°F | Wt 155.0 lb

## 2022-01-04 DIAGNOSIS — O24410 Gestational diabetes mellitus in pregnancy, diet controlled: Secondary | ICD-10-CM

## 2022-01-04 DIAGNOSIS — O0990 Supervision of high risk pregnancy, unspecified, unspecified trimester: Secondary | ICD-10-CM

## 2022-01-04 LAB — URINALYSIS
Bilirubin, UA: NEGATIVE
Glucose, UA: NEGATIVE
Nitrite, UA: NEGATIVE
Protein,UA: NEGATIVE
RBC, UA: NEGATIVE
Specific Gravity, UA: 1.02 (ref 1.005–1.030)
Urobilinogen, Ur: 0.2 mg/dL (ref 0.2–1.0)
pH, UA: 6 (ref 5.0–7.5)

## 2022-01-04 NOTE — Progress Notes (Signed)
[place] Department [facility]  PRENATAL VISIT NOTE  Subjective:  Theresa Chase is a 26 y.o. G1P0000 at [redacted]w[redacted]d being seen today for ongoing prenatal care.  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  Patient reports no complaints.  Contractions: Not present. Vag. Bleeding: None.  Movement: Present. Denies leaking of fluid/ROM.   The following portions of the patient's history were reviewed and updated as appropriate: allergies, current medications, past family history, past medical history, past social history, past surgical history and problem list. Problem list updated.  Objective:   Vitals:   01/04/22 1325  BP: (!) 90/53  Pulse: 80  Temp: 98.2 F (36.8 C)  Weight: 155 lb (70.3 kg)    Fetal Status: Fetal Heart Rate (bpm): 140 Fundal Height: 37 cm Movement: Present  Presentation: Vertex  General:  Alert, oriented and cooperative. Patient is in no acute distress.  Skin: Skin is warm and dry. No rash noted.   Cardiovascular: Normal heart rate noted  Respiratory: Normal respiratory effort, no problems with respiration noted  Abdomen: Soft, gravid, appropriate for gestational age.  Pain/Pressure: Absent     Pelvic: Cervical exam deferred        Extremities: Normal range of motion.  Edema: None  Mental Status: Normal mood and affect. Normal behavior. Normal judgment and thought content.   Assessment and Plan:  Pregnancy: G1P0000 at  [redacted]w[redacted]d  1. Supervision of high risk pregnancy, antepartum Has car seat and ready for baby at home Knows when to go to L&D 25 lb (11.3 kg) 5 lb wt loss in 1 week and doesn't know why GC/Chlamydia/GBS neg on 12/22/21 - Urinalysis (Urine Dip)  2. Diet controlled gestational diabetes mellitus (GDM) in third trimester Blood sugar log values below. Encouraged continued daily exercise and diet modifications. 1 of  8 FBS values are abnormal (range was 75 to 95) 1 of 22 2hr pp values are abnormal (range was 92 to 120) Exercise: walks 20. minutes 7x/wk Diet recall :   Breakfast = 6 peanut butter crackers, water                      Lunch = yesterday: breaded chicken, lettuce, tomatoes, water                      Dinner = yesterday: chicken with lettuce, tomatoes, water, orange                      Snack = 1 yellow apple Water: drinks about  6 bottles of water/day and night - Urinalysis (Urine Dip) Urine: neg glucose, ketones 1+, nit neg  Term labor symptoms and general obstetric precautions including but not limited to vaginal bleeding, contractions, leaking of fluid and fetal movement were reviewed in detail with the patient. Please refer to After Visit Summary for other counseling recommendations.  Return in about 1 week (around 01/11/2022) for routine PNC.  No future appointments.  Herbie Saxon, CNM

## 2022-01-04 NOTE — Progress Notes (Signed)
Urine dip reviewed during clinic visit - no treatment indicated.   Adalberto Cole, RN

## 2022-01-13 ENCOUNTER — Other Ambulatory Visit: Payer: Self-pay

## 2022-01-13 ENCOUNTER — Ambulatory Visit: Payer: Medicaid Other | Admitting: Advanced Practice Midwife

## 2022-01-13 VITALS — BP 105/69 | HR 74 | Temp 97.0°F | Wt 156.2 lb

## 2022-01-13 DIAGNOSIS — O0990 Supervision of high risk pregnancy, unspecified, unspecified trimester: Secondary | ICD-10-CM

## 2022-01-13 DIAGNOSIS — O24410 Gestational diabetes mellitus in pregnancy, diet controlled: Secondary | ICD-10-CM

## 2022-01-13 LAB — URINALYSIS
Bilirubin, UA: NEGATIVE
Glucose, UA: NEGATIVE
Nitrite, UA: NEGATIVE
Protein,UA: NEGATIVE
RBC, UA: NEGATIVE
Specific Gravity, UA: 1.01 (ref 1.005–1.030)
Urobilinogen, Ur: 0.2 mg/dL (ref 0.2–1.0)
pH, UA: 6.5 (ref 5.0–7.5)

## 2022-01-13 NOTE — Progress Notes (Signed)
Urine dip reviewed in clinic with provider.Burt Knack, RN  ?

## 2022-01-13 NOTE — Progress Notes (Signed)
IOL form faxed to [facility] with confirmation.   Jenavieve Freda, RN  

## 2022-01-13 NOTE — Progress Notes (Signed)
[facility] Department ?Maternal Health Clinic ? ?PRENATAL VISIT NOTE ? ?Subjective:  ?Theresa Chase is a 26 y.o. G1P0000 at [redacted]w[redacted]d being seen today for ongoing prenatal care.  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 ? ?Patient reports no complaints.  Contractions: Not present. Vag. Bleeding: None.  Movement: Present. Denies leaking of fluid/ROM.  ? ?The following portions of the patient's history were reviewed and updated as appropriate: allergies, current medications, past family history, past medical history, past social history, past surgical history and problem list. Problem list updated. ? ?Objective:  ? ?Vitals:  ? 01/13/22 1505  ?BP: 105/69  ?Pulse: 74  ?Temp: (!) 97 ?F (36.1 ?C)  ?Weight: 156 lb 3.2 oz (70.9 kg)  ? ? ?Fetal Status: Fetal Heart Rate (bpm): 130 Fundal Height: 37 cm Movement: Present  Presentation: Vertex ? ?General:  Alert, oriented and cooperative. Patient is in no acute distress.  ?Skin: Skin is warm and dry. No rash noted.   ?Cardiovascular: Normal heart rate noted  ?Respiratory: Normal respiratory effort, no problems with respiration noted  ?Abdomen: Soft, gravid, appropriate for gestational age.  Pain/Pressure: Absent     ?Pelvic: Cervical exam performed Dilation: Closed Effacement (%): 50 Station: -3  ?Extremities: Normal range of motion.  Edema: None  ?Mental Status: Normal mood and affect. Normal behavior. Normal judgment and thought content.   ? ?Assessment and Plan:  ?Pregnancy: G1P0000 at 100w1d ? ?1. Diet controlled gestational diabetes mellitus (GDM) in third trimester ?Blood sugar log values below. Encouraged continued daily exercise and diet modifications. ?1 of  10 FBS values are abnormal (range was 70 to 95) ?1 of 28 2hr pp values are abnormal (range was 73 to 120) ?Exercise: walks 20. minutes 7x/wk ?Diet recall :   Breakfast = 4 peanut butter crackers, water ?                     Lunch = chicken, onion, tomato, peppers, 1 slice avocado, water ?                     Dinner = chicken breast with salad, water ?                     Snack = 1 orange, 1 banana, water ?Water: drinks about  5-6 bottles of water/day and night  ?- Urinalysis: neg glucose, tr ketones, neg protein ? ?2. Supervision of high risk pregnancy, antepartum ?IOL paperwork completed due to gestational diabetes date of 01/20/22 chosen--paperwork completed ?Knows when to go to L&D ?Has car seat and ready for baby at home ?- Urinalysis ? ? ?Term labor symptoms and general obstetric precautions including but not limited to vaginal bleeding, contractions, leaking of fluid and fetal movement were reviewed in detail with the patient. ?Please refer to After Visit Summary for other counseling recommendations.  ?No follow-ups on file. ? ?Future Appointments  ?Date Time Provider [place]  ?01/19/2022  1:20 PM [facility] PROVIDER [facility] None  ? ? ?Herbie Saxon, CNM ? ?

## 2022-01-13 NOTE — Progress Notes (Signed)
Patient here for MH RV at 39 1/7. BS log copied, needs urine dip today. States has not yet been contacted by [facility]. Needs cervical check and IOL form completed today.Burt Knack, RN  ?

## 2022-01-16 ENCOUNTER — Other Ambulatory Visit: Payer: Self-pay | Admitting: Obstetrics

## 2022-01-16 DIAGNOSIS — Z3490 Encounter for supervision of normal pregnancy, unspecified, unspecified trimester: Secondary | ICD-10-CM

## 2022-01-16 NOTE — Progress Notes (Signed)
G1P0000 at [redacted]w[redacted]d, by early [place] at [redacted]w[redacted]d.  ?Scheduled for induction of labor for GDM on 01/20/22.  ? 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 ?Prenatal Labs: ?Blood type/Rh O pos  ?Antibody screen neg  ?Rubella Immune  ?Varicella Immune  ?RPR NR  ?HBsAg Neg  ?HIV NR  ?GC neg  ?Chlamydia neg  ?Genetic screening cfDNA negative  ?1 hour GTT 147  ?3 hour GTT 76, 196, 164, 110  ?GBS neg  ? ?Tdap: 10/26/21 ?Flu: 09/15/21 ?Contraception: TBD ?Feeding preference: TBD ? ?____ ?Chari Manning, CNM ?Certified Nurse Midwife ?[facility]  Cl[facility] OB/GYN ?[facility]   ?

## 2022-01-19 ENCOUNTER — Ambulatory Visit: Payer: Medicaid Other | Admitting: Physician Assistant

## 2022-01-19 ENCOUNTER — Other Ambulatory Visit: Payer: Self-pay

## 2022-01-19 ENCOUNTER — Encounter: Payer: Self-pay | Admitting: Obstetrics and Gynecology

## 2022-01-19 ENCOUNTER — Inpatient Hospital Stay
Admission: EM | Admit: 2022-01-19 | Discharge: 2022-01-22 | DRG: 806 | Disposition: A | Discharge status: Home / Self Care | Payer: Medicaid Other | Attending: Obstetrics | Admitting: Obstetrics

## 2022-01-19 ENCOUNTER — Encounter: Payer: Self-pay | Admitting: Physician Assistant

## 2022-01-19 VITALS — BP 94/56 | HR 71 | Temp 97.1°F | Wt 154.0 lb

## 2022-01-19 DIAGNOSIS — R7612 Nonspecific reaction to cell mediated immunity measurement of gamma interferon antigen response without active tuberculosis: Secondary | ICD-10-CM

## 2022-01-19 DIAGNOSIS — Z811 Family history of alcohol abuse and dependence: Secondary | ICD-10-CM | POA: Diagnosis not present

## 2022-01-19 DIAGNOSIS — O24420 Gestational diabetes mellitus in childbirth, diet controlled: Principal | ICD-10-CM | POA: Diagnosis present

## 2022-01-19 DIAGNOSIS — D62 Acute posthemorrhagic anemia: Secondary | ICD-10-CM | POA: Diagnosis not present

## 2022-01-19 DIAGNOSIS — Z6372 Alcoholism and drug addiction in family: Secondary | ICD-10-CM

## 2022-01-19 DIAGNOSIS — A568 Sexually transmitted chlamydial infection of other sites: Secondary | ICD-10-CM

## 2022-01-19 DIAGNOSIS — Z20822 Contact with and (suspected) exposure to covid-19: Secondary | ICD-10-CM | POA: Diagnosis present

## 2022-01-19 DIAGNOSIS — Z8249 Family history of ischemic heart disease and other diseases of the circulatory system: Secondary | ICD-10-CM

## 2022-01-19 DIAGNOSIS — O4400 Placenta previa specified as without hemorrhage, unspecified trimester: Principal | ICD-10-CM

## 2022-01-19 DIAGNOSIS — Z882 Allergy status to sulfonamides status: Secondary | ICD-10-CM

## 2022-01-19 DIAGNOSIS — O98311 Other infections with a predominantly sexual mode of transmission complicating pregnancy, first trimester: Secondary | ICD-10-CM

## 2022-01-19 DIAGNOSIS — O0990 Supervision of high risk pregnancy, unspecified, unspecified trimester: Secondary | ICD-10-CM

## 2022-01-19 DIAGNOSIS — Z3A40 40 weeks gestation of pregnancy: Secondary | ICD-10-CM

## 2022-01-19 DIAGNOSIS — Z833 Family history of diabetes mellitus: Secondary | ICD-10-CM | POA: Diagnosis not present

## 2022-01-19 DIAGNOSIS — O24410 Gestational diabetes mellitus in pregnancy, diet controlled: Secondary | ICD-10-CM

## 2022-01-19 DIAGNOSIS — O2340 Unspecified infection of urinary tract in pregnancy, unspecified trimester: Secondary | ICD-10-CM

## 2022-01-19 DIAGNOSIS — Z3490 Encounter for supervision of normal pregnancy, unspecified, unspecified trimester: Secondary | ICD-10-CM | POA: Diagnosis present

## 2022-01-19 DIAGNOSIS — O0993 Supervision of high risk pregnancy, unspecified, third trimester: Secondary | ICD-10-CM | POA: Diagnosis not present

## 2022-01-19 LAB — RESP PANEL BY RT-PCR (FLU A&B, COVID) ARPGX2
Influenza A by PCR: NEGATIVE
Influenza B by PCR: NEGATIVE
SARS Coronavirus 2 by RT PCR: NEGATIVE

## 2022-01-19 LAB — URINALYSIS
Bilirubin, UA: NEGATIVE
Glucose, UA: NEGATIVE
Ketones, UA: NEGATIVE
Leukocytes,UA: NEGATIVE
Nitrite, UA: NEGATIVE
Protein,UA: NEGATIVE
RBC, UA: NEGATIVE
Specific Gravity, UA: 1.02 (ref 1.005–1.030)
Urobilinogen, Ur: 0.2 mg/dL (ref 0.2–1.0)
pH, UA: 6 (ref 5.0–7.5)

## 2022-01-19 LAB — CBC
HCT: 39.7 % (ref 36.0–46.0)
Hemoglobin: 13.7 g/dL (ref 12.0–15.0)
MCH: 32.1 pg (ref 26.0–34.0)
MCHC: 34.5 g/dL (ref 30.0–36.0)
MCV: 93 fL (ref 80.0–100.0)
Platelets: 268 10*3/uL (ref 150–400)
RBC: 4.27 MIL/uL (ref 3.87–5.11)
RDW: 12.6 % (ref 11.5–15.5)
WBC: 8.9 10*3/uL (ref 4.0–10.5)
nRBC: 0 % (ref 0.0–0.2)

## 2022-01-19 LAB — ABO/RH: ABO/RH(D): O POS

## 2022-01-19 LAB — TYPE AND SCREEN
ABO/RH(D): O POS
Antibody Screen: NEGATIVE

## 2022-01-19 LAB — GLUCOSE, CAPILLARY: Glucose-Capillary: 90 mg/dL (ref 70–99)

## 2022-01-19 MED ORDER — ACETAMINOPHEN 325 MG PO TABS: 650.0000 mg | ORAL_TABLET | ORAL | Status: DC | PRN

## 2022-01-19 MED ORDER — OXYTOCIN BOLUS FROM INFUSION
333.0000 mL | Freq: Once | INTRAVENOUS | Status: AC
  Administered 2022-01-20: 333 mL via INTRAVENOUS

## 2022-01-19 MED ORDER — LACTATED RINGERS IV SOLN
500.0000 mL | INTRAVENOUS | Status: DC | PRN
  Administered 2022-01-20: 1000 mL via INTRAVENOUS

## 2022-01-19 MED ORDER — MISOPROSTOL 25 MCG QUARTER TABLET
25.0000 ug | ORAL_TABLET | ORAL | Status: DC | PRN
  Administered 2022-01-19: 20:00:00 25 ug via BUCCAL
  Filled 2022-01-19: qty 1

## 2022-01-19 MED ORDER — OXYTOCIN-SODIUM CHLORIDE 30-0.9 UT/500ML-% IV SOLN
2.5000 [IU]/h | INTRAVENOUS | Status: DC
  Filled 2022-01-19: qty 1000

## 2022-01-19 MED ORDER — OXYTOCIN-SODIUM CHLORIDE 30-0.9 UT/500ML-% IV SOLN
1.0000 m[IU]/min | INTRAVENOUS | Status: DC
  Administered 2022-01-20: 2 m[IU]/min via INTRAVENOUS

## 2022-01-19 MED ORDER — LACTATED RINGERS IV SOLN: INTRAVENOUS | Status: DC

## 2022-01-19 MED ORDER — LIDOCAINE HCL (PF) 1 % IJ SOLN
INTRAMUSCULAR | Status: AC
  Filled 2022-01-19: qty 30

## 2022-01-19 MED ORDER — SOD CITRATE-CITRIC ACID 500-334 MG/5ML PO SOLN: 30.0000 mL | ORAL | Status: DC | PRN

## 2022-01-19 MED ORDER — TERBUTALINE SULFATE 1 MG/ML IJ SOLN: 0.2500 mg | Freq: Once | INTRAMUSCULAR | Status: DC | PRN

## 2022-01-19 MED ORDER — ONDANSETRON HCL 4 MG/2ML IJ SOLN
4.0000 mg | Freq: Four times a day (QID) | INTRAMUSCULAR | Status: DC | PRN
  Administered 2022-01-20: 4 mg via INTRAVENOUS
  Filled 2022-01-19 (×2): qty 2

## 2022-01-19 MED ORDER — AMMONIA AROMATIC IN INHA
RESPIRATORY_TRACT | Status: AC
  Filled 2022-01-19: qty 10

## 2022-01-19 MED ORDER — MISOPROSTOL 200 MCG PO TABS
ORAL_TABLET | ORAL | Status: AC
  Filled 2022-01-19: qty 4

## 2022-01-19 MED ORDER — BUTORPHANOL TARTRATE 1 MG/ML IJ SOLN: 1.0000 mg | INTRAMUSCULAR | Status: DC | PRN

## 2022-01-19 MED ORDER — MISOPROSTOL 25 MCG QUARTER TABLET
25.0000 ug | ORAL_TABLET | ORAL | Status: DC | PRN
  Administered 2022-01-19: 20:00:00 25 ug via VAGINAL
  Filled 2022-01-19: qty 1

## 2022-01-19 MED ORDER — OXYTOCIN 10 UNIT/ML IJ SOLN
INTRAMUSCULAR | Status: AC
  Filled 2022-01-19: qty 2

## 2022-01-19 MED ORDER — LIDOCAINE HCL (PF) 1 % IJ SOLN
30.0000 mL | INTRAMUSCULAR | Status: DC | PRN
  Filled 2022-01-19: qty 30

## 2022-01-19 NOTE — Progress Notes (Signed)
Patient is a G1P0 at 40 weeks 0 days presenting to Labor and Delivery triage for scant black discharge. Patient denies LOF and reports positive FM. She is scheduled for induction on 3/10 at 0001.  ? ?Drinda Butts CNM in department and notified of pt's arrival. CNM ordered admission and start of induction process.  ? ?VSS. Monitors applied and assessing. Initial FHT 135.  ? ?Patient states she would like to labor without an epidural. Education given on alternate pain management options. All questions answered at this time. ? ?Video translation service used for admission process.  ?

## 2022-01-19 NOTE — H&P (Signed)
OB History & Physical   History of Present Illness:   Chief Complaint: spotting   HPI:  Theresa Chase is a 26 y.o. G1P0000 female at 73w0ddated by [place] 622w2d She presents to L&D for spotting that happened earlier today.  She is scheduled for an induction at midnight tonight for A1GDM.  Decision made to admit and proceed with IOL.  Reports active fetal movement  Contractions: irregular cramping  LOF/SROM: denies  Vaginal bleeding: light spotting that is now brown d/c  Factors complicating pregnancy:  A1GDM Chlamydia infection in pregnancy - TOC negative  Latent TB - starting LTBI postpartum (will needs [facility] number for follow up) Resolved placenta previa  History of depression   Patient Active Problem List   Diagnosis Date Noted   Encounter for planned induction of labor 01/19/2022   Diet controlled gestational diabetes mellitus (GDM) in third trimester dx'd 11/02/21 11/09/2021   Complete placenta previa, resolved per 10/20/21 U/S 08/23/2021   Chlamydia trachomatis infection in pregnancy in first trimester 07/14/21 07/19/2021   Nonspecific reaction to cell mediated immunity measurement of gamma interferon antigen response without active tuberculosis: +Quantiferon Gold on 07/14/21 07/19/2021   Supervision of high risk pregnancy, antepartum 07/14/2021   UTI in pregnancy, antepartum 07/14/2021   Hx of migraine headaches 07/14/2021     Maternal Medical History:   Past Medical History:  Diagnosis Date   Depression    Gestational diabetes 11/09/2021   Migraines    Polycystic ovaries     Past Surgical History:  Procedure Laterality Date   NO PAST SURGERIES      Allergies  Allergen Reactions   Sulfa Antibiotics Other (See Comments)    Diarrhea and abdominal cramping.     Prior to Admission medications   Medication Sig Start Date End Date Taking? Authorizing Provider  Ascorbic Acid (VITAMIN C) 100 MG tablet Take 100 mg by mouth daily.   Yes [provider]  Prenatal Vit-Fe Fumarate-FA (PRENATAL MULTIVITAMIN) TABS tablet Take 1 tablet by mouth daily at 12 noon. 07/14/21  Yes Streilein, Annamarie, PA-C  blood glucose meter kit and supplies KIT Dispense based on patient and insurance preference. Use up to four times daily as directed. 11/09/21   NeCaren MacadamMD     Prenatal care site:  [facility]  Social History: She  reports that she has never smoked. She has never been exposed to tobacco smoke. She has never used smokeless tobacco. She reports that she does not currently use alcohol after a past usage of about 1.0 standard drink per week. She reports that she does not use drugs.  Family History: family history includes Alcohol abuse in her maternal grandfather; Alcoholism in her maternal grandfather; Diabetes in her maternal aunt, maternal grandmother, and paternal grandmother; Healthy in her brother and father; Hypertension in her maternal grandmother and paternal grandmother; Thrombocytopenia in her paternal grandfather; Ulcers in her mother.   Review of Systems: A full review of systems was performed and negative except as noted in the HPI.     Physical Exam:  Vital Signs: BP 100/64 (BP Location: Left Arm)    Pulse 66    Temp 98.2 F (36.8 C) (Oral)    Resp 16    Ht 5' 5"  (1.651 m)    Wt 69.9 kg    LMP 04/13/2021 (Exact Date) Comment: took home pregnancy test yesterday   BMI 25.64 kg/m    General: no acute distress.  HEENT: normocephalic, atraumatic Heart: regular  rate & rhythm.   Lungs: normal respiratory effort Abdomen: soft, gravid, non-tender;  EFW: 8 lbs  Pelvic:   External: Normal external female genitalia  Cervix: Cl/th/high initially by RN   Changed to 1/70/-1/post/med    Extremities: non-tender, symmetric, No edema bilaterally.  DTRs: 2+/2+  Neurologic: Alert & oriented x 3.    Results for orders placed or performed during the hospital encounter of 01/19/22 (from the past 24 hour(s))  CBC     Status: None    Collection Time: 01/19/22  7:22 PM  Result Value Ref Range   WBC 8.9 4.0 - 10.5 K/uL   RBC 4.27 3.87 - 5.11 MIL/uL   Hemoglobin 13.7 12.0 - 15.0 g/dL   HCT 39.7 36.0 - 46.0 %   MCV 93.0 80.0 - 100.0 fL   MCH 32.1 26.0 - 34.0 pg   MCHC 34.5 30.0 - 36.0 g/dL   RDW 12.6 11.5 - 15.5 %   Platelets 268 150 - 400 K/uL   nRBC 0.0 0.0 - 0.2 %  Type and screen     Status: None   Collection Time: 01/19/22  7:22 PM  Result Value Ref Range   ABO/RH(D) O POS    Antibody Screen NEG    Sample Expiration      01/22/2022,2359 Performed at [facility], [address] [place], [place] [address]   Resp Panel by RT-PCR (Flu A&B, Covid) Nasopharyngeal Swab     Status: None   Collection Time: 01/19/22  7:24 PM   Specimen: Nasopharyngeal Swab; Nasopharyngeal(NP) swabs in vial transport medium  Result Value Ref Range   SARS Coronavirus 2 by RT PCR NEGATIVE NEGATIVE   Influenza A by PCR NEGATIVE NEGATIVE   Influenza B by PCR NEGATIVE NEGATIVE  ABO/Rh     Status: None   Collection Time: 01/19/22  8:14 PM  Result Value Ref Range   ABO/RH(D)      O POS Performed at [facility], [address] [place],  [address]     Pertinent Results:  Prenatal Labs: Blood type/Rh O pos   Antibody screen neg  Rubella Immune  Varicella Immune  RPR NR  HBsAg Neg  HIV NR  GC neg  Chlamydia neg  Genetic screening AFP neg  1 hour GTT 147  3 hour GTT 76, 196, 164, 110  GBS Negative    FHT:  FHR: 155 bpm, variability: moderate,  accelerations:  Present,  decelerations:  Present prolong deceleration and occasional late Category/reactivity:  Category II - overall reassuring with moderate variability and accels  UC:   regular, every 2-4 minutes since misoprostol    Cephalic by Leopolds and SVE   [place] MFM OB FOLLOW UP  Result Date: 12/22/2021 ----------------------------------------------------------------------  OBSTETRICS REPORT                       (Signed Final 12/22/2021 12:22  pm) ---------------------------------------------------------------------- Patient Info  ID #:       [id number]                          D.O.B.:  [birth year] (25 yrs)  Name:       Theresa Chase                   Visit Date: 12/22/2021 10:55 am ---------------------------------------------------------------------- Performed By  Attending:        Johnell Comings MD  Referred By:      Judene Companion WHITE  Performed By:     Wilnette Kales        [facility] at                                                             [facility] ---------------------------------------------------------------------- Orders  #  Description                           Code        Ordered By  1  [place] MFM OB FOLLOW UP                   76816.01    [facility] [place] ----------------------------------------------------------------------  #  Order #                     Accession #                Episode #  1  [id number]                   [id number]                 [id number] ---------------------------------------------------------------------- Indications  Gestational diabetes in pregnancy, diet        O24.410  controlled  Encounter for antenatal screening, (placenta   Z36.9  previa-resolved )  [redacted] weeks gestation of pregnancy                Z3A.36  AFP Tetra: Neg ---------------------------------------------------------------------- Fetal Evaluation  Num Of Fetuses:         1  Fetal Heart Rate(bpm):  135  Cardiac Activity:       Observed  Presentation:           Cephalic  Placenta:               Posterior  Amniotic Fluid  AFI FV:      Within normal limits  AFI Sum(cm)     %Tile       Largest Pocket(cm)  17.6            65          5.5  RUQ(cm)       RLQ(cm)       LUQ(cm)        LLQ(cm)  4.2           2.9           5.5            5 ---------------------------------------------------------------------- Biometry  BPD:      93.6  mm     G. Age:  38w 1d          96  %    CI:        75.38   %    70 - 86  FL/HC:      20.8   %    20.1 - 22.1  HC:      341.9  mm     G. Age:  39w 3d         92  %    HC/AC:      1.02        0.93 - 1.11  AC:      333.9  mm     G. Age:  37w 2d         89  %    FL/BPD:     76.0   %    71 - 87  FL:       71.1  mm     G. Age:  36w 3d         57  %    FL/AC:      21.3   %    20 - 24  Est. FW:    3203  gm      7 lb 1 oz     86  % ---------------------------------------------------------------------- Gestational Age  U/S Today:     37w 6d                                        EDD:   01/06/22  Best:          36w 0d     Det. ByLoman Chroman         EDD:   01/19/22                                      (05/28/21) ---------------------------------------------------------------------- Anatomy  Cranium:               Appears normal         Aortic Arch:            Previously seen  Cavum:                 Appears normal         Ductal Arch:            Previously seen  Ventricles:            Appears normal         Diaphragm:              Appears normal  Choroid Plexus:        Previously seen        Stomach:                Appears normal, left                                                                        sided  Cerebellum:            Previously seen        Abdomen:                Appears normal  Posterior Fossa:  Previously seen        Abdominal Wall:         Appears nml (cord                                                                        insert, abd wall)  Nuchal Fold:           Not applicable (>63    Cord Vessels:           Appears normal ([redacted]                         wks GA)                                        vessel cord)  Face:                  Orbits and profile     Kidneys:                Appear normal                         previously seen  Lips:                  Previously seen        Bladder:                Appears normal  Thoracic:              Appears normal          Spine:                  Previously seen  Heart:                 Appears normal         Upper Extremities:      Visualized                         (4CH, axis, and                         situs)  RVOT:                  Previously seen        Lower Extremities:      Visualized  LVOT:                  Previously seen ---------------------------------------------------------------------- Comments  This patient was seen for a follow up growth scan due to  recently diagnosed gestational diabetes.  She reports that her  fingerstick values have mostly been within normal limits.  She was informed that the fetal growth and amniotic fluid  level appears appropriate for her gestational age.  Due to gestational diabetes, delivery should probably occur at  around 2 weeks.  All conversations were held with the patient today with the  help of a Spanish interpreter. ----------------------------------------------------------------------  Johnell Comings, MD Electronically Signed Final Report   12/22/2021 12:22 pm ----------------------------------------------------------------------   Assessment:  Larwance Rote is a 26 y.o. G1P0000 female at 76w0dwith scheduled IOL for A1GDM.   Plan:  1. Admit to Labor & Delivery; consents reviewed and obtained - Covid admission screen  - Dr. JGlennon Macnotified of admission and EFM   2. Fetal Well being  - Fetal Tracing: cat II->cat I - Prolong deceleration while ambulating in hallway - Recovery of baseline with conservative measures  - Overall reassuring with moderate variability and accels following prolong deceleration  - Group B Streptococcus ppx no indicated: GBS Neg - Presentation: cephalic confirmed by SVE   3. Routine OB: - Prenatal labs reviewed, as above - Rh pos - CBC, T&S, RPR on admit - Clear fluids, IVF  4. Induction of labor  - Contractions monitored with external toco - Pelvis adequate for trial of labor  - Plan for induction with misoprostol x 1  dose, previously given at 2000 - Will utilize oxytocin, AROM, and/or cervical balloon as appropriate for continued active management options  - Plan for  continuous fetal monitoring - Maternal pain control as desired; planning low intervention birth options , position changes , and unmedicated labor support options  - Anticipate vaginal delivery  5. Post Partum Planning: - Infant feeding: Breast and formula  - Contraception: Paraguard  - Tdap vaccine: given 10/26/2021 - Flu vaccine: given 09/15/2021  AMinda Meo CNM 01/19/22 9:54 PM  ADrinda Butts CRoan MountainCertified Nurse Midwife KSnowvilleAInstituto [facility]

## 2022-01-19 NOTE — Progress Notes (Signed)
Patient here for MH RV at 50w 0d.  ? ?Patient aware of IOL 01/20/22 at 0001, she verbalized understating to arrive at [facility] ED entrance tonight.  ? ?Urine dip reviewed during clinic visit - no treatment indicated.  ? ?Adalberto Cole, RN ? ?

## 2022-01-19 NOTE — Progress Notes (Signed)
[facility] Department ?Maternal Health Clinic ? ?PRENATAL VISIT NOTE ? ?Subjective:  ?Theresa Chase is a 26 y.o. G1P0000 at [redacted]w[redacted]d being seen today for ongoing prenatal care.  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 ? ?Patient reports no complaints.  Contractions: Not present. Vag. Bleeding: None.  Movement: Present. Denies leaking of fluid/ROM.  ? ?The following portions of the patient's history were reviewed and updated as appropriate: allergies, current medications, past family history, past medical history, past social history, past surgical history and problem list. Problem list updated. ? ?Objective:  ? ?Vitals:  ? 01/19/22 1301  ?BP: (!) 94/56  ?Pulse: 71  ?Temp: (!) 97.1 ?F (36.2 ?C)  ?Weight: 154 lb (69.9 kg)  ? ? ?Fetal Status: Fetal Heart Rate (bpm): 144 Fundal Height: 38 cm Movement: Present  Presentation: Vertex ? ?General:  Alert, oriented and cooperative. Patient is in no acute distress.  ?Skin: Skin is warm and dry. No rash noted.   ?Cardiovascular: Normal heart rate noted  ?Respiratory: Normal respiratory effort, no problems with respiration noted  ?Abdomen: Soft, gravid, appropriate for gestational age.  Pain/Pressure: Absent     ?Pelvic: Cervical exam deferred        ?Extremities: Normal range of motion.  Edema: None  ?Mental Status: Normal mood and affect. Normal behavior. Normal judgment and thought content.  ? ?Assessment and Plan:  ?Pregnancy:  G1P0000 at [redacted]w[redacted]d ? ?1. Supervision of high risk pregnancy, antepartum ?40 0/7 wk today. Enc pt to keep IOL as sched by reporting to hosp at midnight today as scheduled. Pt states understanding. Will bring friend, has infant carseat. ?- Urinalysis (Urine Dip) ? ?2. Diet controlled gestational diabetes mellitus (GDM) in third trimester dx'd 11/02/21 ?Reviewed home BS log: FBS 64-89, 2h pp 100-118 (all in desired range). CCUA neg glu. Excellent GDM via diet/exercise by these metrics. Counseled pt re: plan for 2h oGTT at 6wk pp visit. ? ?3. Nonspecific reaction to cell mediated immunity measurement of gamma interferon antigen response without active tuberculosis: +Quantiferon Gold on 07/14/21 ?Reiterated plan for pt to seek treatment for latent TB at [place] when postpartum, due to county of residence. Pt in agreement with this plan. ? ? ?Term labor symptoms and general obstetric precautions including but not limited to vaginal bleeding, contractions, leaking of fluid and fetal movement were reviewed in detail with the patient. ?Due to language barrier, an interpreter Dot Lanes) was present during the provider portion of the visit with this patient. ? ?Please refer to After Visit Summary for other counseling recommendations.  ?Return in about 6 weeks (around 03/02/2022) for postpartum visit and 2h GTT. ? ?No future appointments. ? ?Lora Havens, PA-C ?

## 2022-01-20 ENCOUNTER — Inpatient Hospital Stay: Payer: Medicaid Other | Admitting: Anesthesiology

## 2022-01-20 ENCOUNTER — Encounter: Payer: Self-pay | Admitting: Obstetrics and Gynecology

## 2022-01-20 DIAGNOSIS — O0993 Supervision of high risk pregnancy, unspecified, third trimester: Secondary | ICD-10-CM | POA: Diagnosis not present

## 2022-01-20 LAB — GLUCOSE, CAPILLARY
Glucose-Capillary: 73 mg/dL (ref 70–99)
Glucose-Capillary: 93 mg/dL (ref 70–99)
Glucose-Capillary: 104 mg/dL — ABNORMAL HIGH (ref 70–99)
Glucose-Capillary: 108 mg/dL — ABNORMAL HIGH (ref 70–99)
Glucose-Capillary: 121 mg/dL — ABNORMAL HIGH (ref 70–99)

## 2022-01-20 LAB — RPR: RPR Ser Ql: NONREACTIVE

## 2022-01-20 MED ORDER — BENZOCAINE-MENTHOL 20-0.5 % EX AERO
1.0000 "application " | INHALATION_SPRAY | CUTANEOUS | Status: DC | PRN
  Administered 2022-01-21: 1 via TOPICAL
  Filled 2022-01-20: qty 56

## 2022-01-20 MED ORDER — COCONUT OIL OIL
1.0000 "application " | TOPICAL_OIL | Status: DC | PRN
  Administered 2022-01-21: 1 via TOPICAL
  Filled 2022-01-20 (×2): qty 120

## 2022-01-20 MED ORDER — IBUPROFEN 600 MG PO TABS
600.0000 mg | ORAL_TABLET | Freq: Four times a day (QID) | ORAL | Status: DC
  Administered 2022-01-21 – 2022-01-22 (×4): 600 mg via ORAL
  Filled 2022-01-20 (×4): qty 1

## 2022-01-20 MED ORDER — SIMETHICONE 80 MG PO CHEW: 80.0000 mg | CHEWABLE_TABLET | ORAL | Status: DC | PRN

## 2022-01-20 MED ORDER — FERROUS SULFATE 325 (65 FE) MG PO TABS
325.0000 mg | ORAL_TABLET | Freq: Two times a day (BID) | ORAL | Status: DC
  Administered 2022-01-21 (×2): 325 mg via ORAL
  Filled 2022-01-20 (×2): qty 1

## 2022-01-20 MED ORDER — LACTATED RINGERS IV SOLN
500.0000 mL | Freq: Once | INTRAVENOUS | Status: AC
  Administered 2022-01-20: 500 mL via INTRAVENOUS

## 2022-01-20 MED ORDER — ACETAMINOPHEN 325 MG PO TABS: 650.0000 mg | ORAL_TABLET | ORAL | Status: DC | PRN

## 2022-01-20 MED ORDER — OXYCODONE HCL 5 MG PO TABS: 5.0000 mg | ORAL_TABLET | ORAL | Status: DC | PRN

## 2022-01-20 MED ORDER — WITCH HAZEL-GLYCERIN EX PADS: 1.0000 "application " | MEDICATED_PAD | CUTANEOUS | Status: DC | PRN

## 2022-01-20 MED ORDER — EPHEDRINE 5 MG/ML INJ
10.0000 mg | INTRAVENOUS | Status: DC | PRN
  Filled 2022-01-20: qty 2

## 2022-01-20 MED ORDER — DOCUSATE SODIUM 100 MG PO CAPS
100.0000 mg | ORAL_CAPSULE | Freq: Two times a day (BID) | ORAL | Status: DC
  Administered 2022-01-21 (×2): 100 mg via ORAL
  Filled 2022-01-20 (×2): qty 1

## 2022-01-20 MED ORDER — PRENATAL MULTIVITAMIN CH
1.0000 | ORAL_TABLET | Freq: Every day | ORAL | Status: DC
  Administered 2022-01-21: 1 via ORAL
  Filled 2022-01-20: qty 1

## 2022-01-20 MED ORDER — OXYCODONE HCL 5 MG PO TABS: 10.0000 mg | ORAL_TABLET | ORAL | Status: DC | PRN

## 2022-01-20 MED ORDER — PHENYLEPHRINE 40 MCG/ML (10ML) SYRINGE FOR IV PUSH (FOR BLOOD PRESSURE SUPPORT)
80.0000 ug | PREFILLED_SYRINGE | INTRAVENOUS | Status: DC | PRN
  Filled 2022-01-20: qty 10

## 2022-01-20 MED ORDER — DIPHENHYDRAMINE HCL 50 MG/ML IJ SOLN: 12.5000 mg | INTRAMUSCULAR | Status: DC | PRN

## 2022-01-20 MED ORDER — ONDANSETRON HCL 4 MG PO TABS: 4.0000 mg | ORAL_TABLET | ORAL | Status: DC | PRN

## 2022-01-20 MED ORDER — BUPIVACAINE HCL (PF) 0.25 % IJ SOLN
INTRAMUSCULAR | Status: DC | PRN
  Administered 2022-01-20: 5 mL via EPIDURAL
  Administered 2022-01-20: 4 mL via EPIDURAL

## 2022-01-20 MED ORDER — DIBUCAINE (PERIANAL) 1 % EX OINT: 1.0000 "application " | TOPICAL_OINTMENT | CUTANEOUS | Status: DC | PRN

## 2022-01-20 MED ORDER — LIDOCAINE HCL (PF) 1 % IJ SOLN
INTRAMUSCULAR | Status: DC | PRN
  Administered 2022-01-20: 3 mL

## 2022-01-20 MED ORDER — LIDOCAINE-EPINEPHRINE (PF) 1.5 %-1:200000 IJ SOLN
INTRAMUSCULAR | Status: DC | PRN
  Administered 2022-01-20: 3 mL via PERINEURAL

## 2022-01-20 MED ORDER — DIPHENHYDRAMINE HCL 25 MG PO CAPS: 25.0000 mg | ORAL_CAPSULE | Freq: Four times a day (QID) | ORAL | Status: DC | PRN

## 2022-01-20 MED ORDER — ONDANSETRON HCL 4 MG/2ML IJ SOLN: 4.0000 mg | INTRAMUSCULAR | Status: DC | PRN

## 2022-01-20 MED ORDER — TETANUS-DIPHTH-ACELL PERTUSSIS 5-2.5-18.5 LF-MCG/0.5 IM SUSY
0.5000 mL | PREFILLED_SYRINGE | Freq: Once | INTRAMUSCULAR | Status: DC
  Filled 2022-01-20: qty 0.5

## 2022-01-20 MED ORDER — FENTANYL-BUPIVACAINE-NACL 0.5-0.125-0.9 MG/250ML-% EP SOLN
12.0000 mL/h | EPIDURAL | Status: DC | PRN
  Administered 2022-01-20: 12 mL/h via EPIDURAL
  Filled 2022-01-20: qty 250

## 2022-01-20 NOTE — Anesthesia Preprocedure Evaluation (Signed)
Anesthesia Evaluation  ?Patient identified by MRN, date of birth, ID band ?Patient awake ? ? ? ?Reviewed: ?Allergy & Precautions, H&P , NPO status , Patient's Chart, lab work & pertinent test results ? ?History of Anesthesia Complications ?Negative for: history of anesthetic complications ? ?Airway ?Mallampati: II ? ? ? ? ? ? Dental ?no notable dental hx. ? ?  ?Pulmonary ?neg pulmonary ROS,  ?  ?Pulmonary exam normal ? ? ? ? ? ? ? Cardiovascular ?negative cardio ROS ?Normal cardiovascular exam ? ? ?  ?Neuro/Psych ?negative neurological ROS ? negative psych ROS  ? GI/Hepatic ?negative GI ROS, Neg liver ROS,   ?Endo/Other  ?negative endocrine ROSdiabetes ? Renal/GU ?negative Renal ROS  ?negative genitourinary ?  ?Musculoskeletal ? ? Abdominal ?  ?Peds ? Hematology ?negative hematology ROS ?(+)   ?Anesthesia Other Findings ? ? Reproductive/Obstetrics ?(+) Pregnancy ? ?  ? ? ? ? ? ? ? ? ? ? ? ? ? ?  ?  ? ? ? ? ? ? ? ? ?Anesthesia Physical ?Anesthesia Plan ? ?ASA: 2 ? ?Anesthesia Plan: Epidural  ? ?Post-op Pain Management: Epidural*  ? ?Induction:  ? ?PONV Risk Score and Plan:  ? ?Airway Management Planned:  ? ?Additional Equipment:  ? ?Intra-op Plan:  ? ?Post-operative Plan:  ? ?Informed Consent: I have reviewed the patients History and Physical, chart, labs and discussed the procedure including the risks, benefits and alternatives for the proposed anesthesia with the patient or authorized representative who has indicated his/her understanding and acceptance.  ? ? ? ? ? ?Plan Discussed with: Anesthesiologist and CRNA ? ?Anesthesia Plan Comments:   ? ? ? ? ? ? ?Anesthesia Quick Evaluation ? ?

## 2022-01-20 NOTE — Anesthesia Procedure Notes (Signed)
Epidural ?Patient location during procedure: OB ?Start time: 01/20/2022 4:18 PM ?End time: 01/20/2022 4:43 PM ? ?Staffing ?Resident/CRNA: Junious Silk, CRNA ?Performed: resident/CRNA  ? ?Preanesthetic Checklist ?Completed: patient identified, IV checked, site marked, risks and benefits discussed, surgical consent, monitors and equipment checked, pre-op evaluation and timeout performed ? ?Epidural ?Patient position: sitting ?Prep: Betadine ?Patient monitoring: heart rate, continuous pulse ox and blood pressure ?Approach: midline ?Location: L3-L4 ?Injection technique: LOR saline ? ?Needle:  ?Needle type: Tuohy  ?Needle gauge: 17 G ?Needle length: 9 cm and 9 ?Catheter type: closed end flexible ?Catheter size: 20 Guage ?Test dose: negative and 1.5% lidocaine with Epi 1:200 K ? ?Assessment ?Sensory level: T10 ?Events: blood not aspirated, injection not painful, no injection resistance, no paresthesia and negative IV test ? ?Additional Notes ? ? ?Patient tolerated the insertion well without complications.Reason for block:procedure for pain ? ? ? ?

## 2022-01-20 NOTE — Progress Notes (Signed)
Labor Progress Note ? ?Theresa Chase is a 26 y.o. G1P0000 at [redacted]w[redacted]d by ultrasound admitted for induction of labor due to A1GDM. ?-Interpreter used for all communication  ? ?Subjective: feeling mild cramping  ? ?Objective: ?BP 116/73 (BP Location: Left Arm)   Pulse 68   Temp 98.3 ?F (36.8 ?C) (Oral)   Resp 16   Ht 5\' 5"  (1.651 m)   Wt 69.9 kg   LMP 04/13/2021 (Exact Date) Comment: took home pregnancy test yesterday  BMI 25.64 kg/m?  ?Notable VS details: reviewed  ? ?Fetal Assessment: ?FHT:  FHR: 145 bpm, variability: moderate,  accelerations:  Present,  decelerations:  Absent ?Category/reactivity:  Category I ?UC:   regular, every 2-4 minutes ?SVE:   2/70/-2/ant/med ?Membrane status: Intact ?Amniotic color: N/A ? ?Labs: ?Lab Results  ?Component Value Date  ? WBC 8.9 01/19/2022  ? HGB 13.7 01/19/2022  ? HCT 39.7 01/19/2022  ? MCV 93.0 01/19/2022  ? PLT 268 01/19/2022  ? ? ?Assessment / Plan: ?Induction of labor d/t A1GDM ?-s/p 1 dose of buccal and vaginal misoprostol  ?-oxytocin started and was slowly increased to 4 milliunits/min.   ?-d/c d/t prolong decel  ?-Now cat 1 tracing, will restart oxytocin now ?-We reviewed the fetal tracing and fetal response for contractions.  Discussed that baby shows signs of reassurance and has recovered well.  A cesarean birth is not indicated at this time but may be necessary for non-reassuring fetal responses to labor.  ? ?Labor:  Latent induced labor  ?Fetal Wellbeing:  Category I ?Pain Control:  Labor support without medications ?I/D:  n/a ?Anticipated MOD:  NSVD ? ?03/21/2022, CNM ?01/20/2022, 6:26 AM ? ? ? ? ? ? ? ? ?

## 2022-01-20 NOTE — Progress Notes (Signed)
Labor Progress Note ? ?Theresa Chase is a 26 y.o. G1P0000 at [redacted]w[redacted]d by ultrasound admitted for induction of labor due to A1GDM. ?-Interpreter used for all communication  ? ?Subjective: Pt is more comfortable with her epidural ? ?Objective: ?BP 123/62 (BP Location: Right Arm)   Pulse 76   Temp 98.2 ?F (36.8 ?C) (Oral)   Resp 18   Ht 5\' 5"  (1.651 m)   Wt 69.9 kg   LMP 04/13/2021 (Exact Date) Comment: took home pregnancy test yesterday  BMI 25.64 kg/m?  ?Notable VS details: reviewed  ? ?Fetal Assessment: ?FHT:  FHR: 145 bpm, variability: moderate,  accelerations:  Present,  decelerations:  Present lates ?Category/reactivity:  Category II ?UC:   regular, every 1-4 minutes ?SVE:   Dilation: 8 ?Effacement (%): 100 ?Cervical Position: Posterior ?Station: -2 ?Presentation: Vertex ?Exam by:: [email or web address], CNM  ?Membrane status: Intact ?Amniotic color: N/A ? ?Labs: ?Lab Results  ?Component Value Date  ? WBC 8.9 01/19/2022  ? HGB 13.7 01/19/2022  ? HCT 39.7 01/19/2022  ? MCV 93.0 01/19/2022  ? PLT 268 01/19/2022  ? ? ?Assessment / Plan: ?Induction of labor d/t A1GDM ?-s/p 1 dose of buccal and vaginal misoprostol yesterday at 2002 ?-oxytocin started this morning at 0236 turned off for a decel at 0451   ?-Restarted oxytocin at 0620 d/t a cat I tracing ?-Currently on 39mU ? ?-During Foley placement 3 late decels noted with recovery when pt moved to her right side.  Will continue to monitor FHTs and consider AROM soon. ? ?Labor: Progressing on Pitocin, will continue to increase then AROM ?Fetal Wellbeing:  Category I ?Pain Control:  Epidural ?I/D:   Afebrile, GBS neg, Intact ?Anticipated MOD:  NSVD ? ?12m, CNM ?01/20/2022, 5:56 PM ?

## 2022-01-20 NOTE — Discharge Summary (Signed)
Obstetrical Discharge Summary ? ?Patient Name: Theresa Chase ?DOB: 05/13/1996 ?MRN: [id number] ? ?Date of Admission: 01/19/2022 ?Date of Delivery: 01/20/22 ?Delivered by: Rochele Raring CNM ?Date of Discharge: 01/22/2022 ? ? ?Primary OB: [facility]  ?BPZ:WCHENID'P last menstrual period was 04/13/2021 (exact date). ?EDC Estimated Date of Delivery: 01/19/22 ?Gestational Age at Delivery: [redacted]w[redacted]d ? ?Antepartum complications:  ?AO2UMP?Chlamydia infection in pregnancy - TOC negative  ?Latent TB - starting LTBI postpartum (will needs [facility] number for follow up) ?Resolved placenta previa  ?History of depression  ? ?Admitting Diagnosis: IOL of labor ?Secondary Diagnosis: ?Patient Active Problem List  ? Diagnosis Date Noted  ? Encounter for planned induction of labor 01/19/2022  ? Diet controlled gestational diabetes mellitus (GDM) in third trimester dx'd 11/02/21 11/09/2021  ? Complete placenta previa, resolved per 10/20/21 U/S 08/23/2021  ? Chlamydia trachomatis infection in pregnancy in first trimester 07/14/21 07/19/2021  ? 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 Supervision of high risk pregnancy, antepartum 07/14/2021  ? UTI in pregnancy, antepartum 07/14/2021  ? Hx of migraine headaches 07/14/2021  ? ? ?Augmentation: Pitocin and Cytotec ?Complications: None ? ?Intrapartum complications/course:  ?Delivery Type: spontaneous vaginal delivery ?Anesthesia: epidural ?Placenta: spontaneous ?Laceration: right sulcus ?Episiotomy: none ?Newborn Data: ?Live born female  ?Birth Weight:  3220 g ?APGAR: 9, 9 ? ?Newborn Delivery   ?Birth date/time: 01/20/2022 20:23:00 ?Delivery type: Vaginal, Spontaneous ?  ?  ? ?26y.o. G1P0000 at 484w1dresenting for IOL for A1GDM, SROM with Mec stained fluid.  She progressed to complete and pushed over an intact perineum and delivered the fetal head, followed promptly by the shoulders. She was  in control the whole time, and the baby placed on the maternal abdomen. Delayed cord clamping and the pt's friend cut the baby's cord, while the baby was skin to skin. The placenta delivered spontaneously and intact. Sulcus laceration noted and repaired. Mom and baby tolerated the procedure well.  ? ?Postpartum Procedures:  ? ?Post partum course:  ?Patient had an uncomplicated postpartum course.  By time of discharge on PPD#2, her pain was controlled on oral pain medications; she had appropriate lochia and was ambulating, voiding without difficulty and tolerating regular diet.  She was deemed stable for discharge to home.   ? ?Discharge Physical Exam:  ?BP 105/67 (BP Location: Right Arm)   Pulse 73   Temp 98.1 ?F (36.7 ?C) (Oral)   Resp 18   Ht _0  (1.651 m)   Wt 69.9 kg   LMP 04/13/2021 (Exact Date) Comment: took home pregnancy test yesterday  SpO2 99%   BMI 25.64 kg/m?  ? ?General: alert and no distress ?Pulm: normal respiratory effort ?Lochia: appropriate ?Abdomen: soft, NT ?Uterine Fundus: firm, below umbilicus ?Extremities: No evidence of DVT seen on physical exam. No lower extremity edema. ?Edinburgh:  ?EdFlavia Shipperostnatal Depression Scale Screening Chase 01/21/2022  ?I have been able to laugh and see the funny side of things. 0  ?I have looked forward with enjoyment to things. 0  ?I have blamed myself unnecessarily when things went wrong. 0  ?I have been anxious or worried for no good reason. 0  ?I have felt scared or panicky for no good reason. 0  ?Things have been getting on top of me. 0  ?I have been so unhappy that I have had difficulty sleeping. 0  ?I have felt sad or miserable. 0  ?I have been so unhappy  that I have been crying. 0  ?The thought of harming myself has occurred to me. 0  ?Edinburgh Postnatal Depression Scale Total 0  ?  ? ?Labs: ?CBC Latest Ref Rng & Units 01/21/2022 01/19/2022 07/14/2021  ?WBC 4.0 - 10.5 K/uL 13.9(H) 8.9 11.3(H)  ?Hemoglobin 12.0 - 15.0 g/dL 11.4(L) 13.7 13.0   ?Hematocrit 36.0 - 46.0 % 33.2(L) 39.7 38.1  ?Platelets 150 - 400 K/uL 218 268 305  ? ?O POS ?Performed at [facility], [address], [place], [place] [address] ? ?Hemoglobin  ?Date Value Ref Range Status  ?01/21/2022 11.4 (L) 12.0 - 15.0 g/dL Final  ?07/14/2021 13.0 11.1 - 15.9 g/dL Final  ? ?HCT  ?Date Value Ref Range Status  ?01/21/2022 33.2 (L) 36.0 - 46.0 % Final  ? ?Hematocrit  ?Date Value Ref Range Status  ?07/14/2021 38.1 34.0 - 46.6 % Final  ? ? ?Disposition: stable, discharge to home ?Baby Feeding: breastmilk and formula ?Baby Disposition: home with mom ? ?Contraception: Paragard ? ?Prenatal Labs:  ?Blood type/Rh O pos   ?Antibody screen neg  ?Rubella Immune  ?Varicella Immune  ?RPR NR  ?HBsAg Neg  ?HIV NR  ?GC neg  ?Chlamydia neg  ?Genetic screening AFP neg  ?1 hour GTT 147  ?3 hour GTT 76, 196, 164, 110  ?GBS Negative   ? ?Rh Immune globulin given: n/a ?Rubella vaccine given: Immune ?Varicella vaccine given: Immune ?Tdap vaccine given in AP or PP setting: given 10/26/2021 ?Flu vaccine given in AP or PP setting: given 09/15/2021 ? ?Plan: ?Theresa Chase was discharged to home in good condition. ? ?Discharge Instructions: Per After Visit Summary. ?Activity: Advance as tolerated. Pelvic rest for 6 weeks.   ?Diet: Regular ?Discharge Medications: ?Allergies as of 01/22/2022   ? ?   Reactions  ? Sulfa Antibiotics Other (See Comments)  ? Diarrhea and abdominal cramping.   ? ?  ? ?  ?Medication List  ?  ? ?STOP taking these medications   ? ?blood glucose meter kit and supplies Kit ?  ? ?  ? ?TAKE these medications   ? ?acetaminophen 325 MG tablet ?Commonly known as: Tylenol ?Take 2 tablets (650 mg total) by mouth every 4 (four) hours as needed (for pain scale < 4). ?  ?benzocaine-Menthol 20-0.5 % Aero ?Commonly known as: DERMOPLAST ?Apply 1 application. topically as needed for irritation (perineal discomfort). ?  ?coconut oil Oil ?Apply 1 application. topically as needed. ?  ?dibucaine 1 %  Oint ?Commonly known as: NUPERCAINAL ?Place 1 application. rectally as needed for hemorrhoids. ?  ?docusate sodium 100 MG capsule ?Commonly known as: COLACE ?Take 1 capsule (100 mg total) by mouth 2 (two) times daily. ?  ?ferrous sulfate 325 (65 FE) MG tablet ?Take 1 tablet (325 mg total) by mouth 2 (two) times daily with a meal. ?  ?ibuprofen 600 MG tablet ?Commonly known as: ADVIL ?Take 1 tablet (600 mg total) by mouth every 6 (six) hours. ?  ?prenatal multivitamin Tabs tablet ?Take 1 tablet by mouth daily at 12 noon. ?  ?simethicone 80 MG chewable tablet ?Commonly known as: MYLICON ?Chew 1 tablet (80 mg total) by mouth as needed for flatulence. ?  ?vitamin C 100 MG tablet ?Take 100 mg by mouth daily. ?  ?witch hazel-glycerin pad ?Commonly known as: TUCKS ?Apply 1 application. topically as needed for hemorrhoids. ?  ? ?  ? ?Outpatient follow up:  ? Follow-up Information   ? ? [facility] DEPT Follow up in 2  week(s).   ?Why: mood check ?Contact information: ?[place] [address] ?[phone] ? ?  ?  ? ? [facility] DEPT Follow up in 6 week(s).   ?Why: PP visit and Paragard IUD ?Contact information: ?[place] [address] ?[phone] ? ?  ?  ? ? Health, [facility]. Schedule an appointment as soon as possible for a visit.   ?Why: f/u for latent TB postpartum ?Contact information: ?[address] ?[place] [address] ?[phone] ? ? ?  ?  ? ?  ?  ? ?  ? ? ?Signed: ?Avelino Leeds CNM ?01/22/2022 9:16 AM ? ?

## 2022-01-20 NOTE — Progress Notes (Signed)
Labor Progress Note ? ?[name] is a 26 y.o. G1P0000 at [redacted]w[redacted]d by ultrasound admitted for induction of labor due to A1GDM. ?-Interpreter used for all communication  ? ?Subjective: assumed care, on birth ball, reports feeling mild cramping  ? ?Objective: ?BP 121/61 (BP Location: Left Arm)   Pulse 70   Temp 98.3 ?F (36.8 ?C) (Oral)   Resp 16   Ht 5\' 5"  (1.651 m)   Wt 69.9 kg   LMP 04/13/2021 (Exact Date) Comment: took home pregnancy test yesterday  BMI 25.64 kg/m?  ?Notable VS details: reviewed  ? ?Fetal Assessment: ?FHT:  FHR: 140 bpm, variability: moderate,  accelerations:  Present,  decelerations:  Absent ?Category/reactivity:  Category I ?UC:   regular, every 1-3 minutes ?SVE:   2/70/-2/ant/med ?Membrane status: Intact ?Amniotic color: N/A ? ?Labs: ?Lab Results  ?Component Value Date  ? WBC 8.9 01/19/2022  ? HGB 13.7 01/19/2022  ? HCT 39.7 01/19/2022  ? MCV 93.0 01/19/2022  ? PLT 268 01/19/2022  ? ? ?Assessment / Plan: ?Induction of labor d/t A1GDM ?-s/p 1 dose of buccal and vaginal misoprostol yesterday at 2002 ?-oxytocin started this morning at 0236 turned off for a decel at 0451   ?-Restarted oxytocin at 0620 d/t a cat I tracing ?-Currently on 9mU ? ?Labor:  Latent induced labor  ?Fetal Wellbeing:  Category I ?Pain Control:  Labor support without medications ?I/D:   Afebrile, GBS neg, Intact ?Anticipated MOD:  NSVD ? ?11m, CNM ?01/20/2022, 11:30 AM ? ? ? ? ? ? ? ? ?

## 2022-01-20 NOTE — Progress Notes (Signed)
Labor Progress Note ? ?Theresa Chase is a 26 y.o. G1P0000 at [redacted]w[redacted]d by ultrasound admitted for induction of labor due to A1GDM. ? ?Subjective: coping well with contractions  ? ?Objective: ?BP 116/73 (BP Location: Left Arm)   Pulse 68   Temp 98.3 ?F (36.8 ?C) (Oral)   Resp 16   Ht 5\' 5"  (1.651 m)   Wt 69.9 kg   LMP 04/13/2021 (Exact Date) Comment: took home pregnancy test yesterday  BMI 25.64 kg/m?  ?Notable VS details: reveiwed ? ?Fetal Assessment: ?FHT:  FHR: 145 bpm, variability: moderate,  accelerations:  Present,  decelerations:  Present variable and prolong  ?Category/reactivity:  Category II ?UC:   regular, every 2 minutes ?SVE:   deferred  ?Membrane status: Intact ?Amniotic color: N/A ? ?Labs: ?Lab Results  ?Component Value Date  ? WBC 8.9 01/19/2022  ? HGB 13.7 01/19/2022  ? HCT 39.7 01/19/2022  ? MCV 93.0 01/19/2022  ? PLT 268 01/19/2022  ? ? ?Assessment / Plan: ?Induction of labor d/t A1GDM ?-s/p 1 dose of buccal and vaginal misoprostol  ?-oxytocin started and was slowly increased to 4 milliunits/min.   ?-d/c d/t prolong decel  ?-will leave oxytocin off to allow for recovery and plan to restart when appropriate  ? ?Labor:  Latent induced labor  ?Fetal Wellbeing:  Category II  ?-overall reassuring with moderate variability and accels  ?-spontaneous return to baseline with conservative measures  ?Pain Control:  Labor support without medications ?I/D:  n/a ?Anticipated MOD:  NSVD ? ?03/21/2022, CNM ?01/20/2022, 4:53 AM ? ? ? ? ? ? ? ? ?

## 2022-01-20 NOTE — Progress Notes (Signed)
Labor Progress Note ? ?Theresa Chase is a 26 y.o. G1P0000 at [redacted]w[redacted]d by ultrasound admitted for induction of labor due to A1GDM. ?-Interpreter used for all communication  ? ?Subjective: Pt is reporting severe UCs with N/V ? ?Objective: ?BP 123/62 (BP Location: Right Arm)   Pulse 76   Temp 98.2 ?F (36.8 ?C) (Oral)   Resp 18   Ht 5\' 5"  (1.651 m)   Wt 69.9 kg   LMP 04/13/2021 (Exact Date) Comment: took home pregnancy test yesterday  BMI 25.64 kg/m?  ?Notable VS details: reviewed  ? ?Fetal Assessment: ?FHT:  FHR: 140 bpm, variability: moderate,  accelerations:  Present,  decelerations:  Absent ?Category/reactivity:  Category I ?UC:   regular, every 1-4 minutes ?SVE:   Dilation: 5 ?Effacement (%): 100 ?Cervical Position: Posterior ?Station: -2 ?Presentation: Vertex ?Exam by:: [email or web address], CNM  ?Membrane status: Intact ?Amniotic color: N/A ? ?Labs: ?Lab Results  ?Component Value Date  ? WBC 8.9 01/19/2022  ? HGB 13.7 01/19/2022  ? HCT 39.7 01/19/2022  ? MCV 93.0 01/19/2022  ? PLT 268 01/19/2022  ? ? ?Assessment / Plan: ?Induction of labor d/t A1GDM ?-s/p 1 dose of buccal and vaginal misoprostol yesterday at 2002 ?-oxytocin started this morning at 0236 turned off for a decel at 0451   ?-Restarted oxytocin at 0620 d/t a cat I tracing ?-Currently on 74mU ? ?Labor: Progressing on Pitocin, will continue to increase then AROM ?Fetal Wellbeing:  Category I ?Pain Control:   Asking for epidural ?I/D:   Afebrile, GBS neg, Intact ?Anticipated MOD:  NSVD ? ?13m, CNM ?01/20/2022, 4:15 PM ?

## 2022-01-21 LAB — CBC
HCT: 33.2 % — ABNORMAL LOW (ref 36.0–46.0)
Hemoglobin: 11.4 g/dL — ABNORMAL LOW (ref 12.0–15.0)
MCH: 32.3 pg (ref 26.0–34.0)
MCHC: 34.3 g/dL (ref 30.0–36.0)
MCV: 94.1 fL (ref 80.0–100.0)
Platelets: 218 10*3/uL (ref 150–400)
RBC: 3.53 MIL/uL — ABNORMAL LOW (ref 3.87–5.11)
RDW: 12.6 % (ref 11.5–15.5)
WBC: 13.9 10*3/uL — ABNORMAL HIGH (ref 4.0–10.5)
nRBC: 0 % (ref 0.0–0.2)

## 2022-01-21 NOTE — Anesthesia Postprocedure Evaluation (Signed)
Anesthesia Post Note ? ?Patient: Theresa Chase ? ?Procedure(s) Performed: AN AD HOC LABOR EPIDURAL ? ?Patient location during evaluation: Mother Baby ?Anesthesia Type: Epidural ?Level of consciousness: awake and alert ?Pain management: pain level controlled ?Vital Signs Assessment: post-procedure vital signs reviewed and stable ?Respiratory status: spontaneous breathing, nonlabored ventilation and respiratory function stable ?Cardiovascular status: stable ?Postop Assessment: no headache, no backache and epidural receding ?Anesthetic complications: no ?Comments: Patient comfortable, says her experience here was a lot better than prior epidural. She did express concerns over higher risk of PDPH given her prior history. I advised her to inform nurses if she has the classic positional headache develop. Patient thankful ? ? ?No notable events documented. ? ? ?Last Vitals:  ?Vitals:  ? 01/21/22 0432 01/21/22 0736  ?BP: (!) 102/57 (!) 100/40  ?Pulse: 80 90  ?Resp: 18 18  ?Temp: 37.2 ?C 36.7 ?C  ?SpO2: 98% 98%  ?  ?Last Pain:  ?Vitals:  ? 01/21/22 0745  ?TempSrc:   ?PainSc: 0-No pain  ? ? ?  ?  ?  ?  ?  ?  ? ?Arita Miss ? ? ? ? ?

## 2022-01-21 NOTE — Progress Notes (Signed)
Postpartum Day  1 ? ?Subjective: ?no complaints, up ad lib, voiding, tolerating PO, and + flatus ? ?Doing well, no concerns. Ambulating without difficulty, pain managed with PO meds, tolerating regular diet, and voiding without difficulty.  ? ?No fever/chills, chest pain, shortness of breath, nausea/vomiting, or leg pain. No nipple or breast pain. No headache, visual changes, or RUQ/epigastric pain. ? ?Objective: ?BP (!) 100/40 (BP Location: Right Arm)   Pulse 90   Temp 98 ?F (36.7 ?C) (Oral)   Resp 18   Ht 5\' 5"  (1.651 m)   Wt 69.9 kg   LMP 04/13/2021 (Exact Date) Comment: took home pregnancy test yesterday  SpO2 98%   BMI 25.64 kg/m?  ?  ?Physical Exam:  ?General: alert, cooperative, and appears stated age ?Breasts: soft/nontender ?CV: RRR ?Pulm: nl effort, CTABL ?Abdomen: soft, non-tender, active bowel sounds ?Uterine Fundus: firm ?Perineum: minimal edema, laceration hemostatic ?Lochia: appropriate ?DVT Evaluation: No evidence of DVT seen on physical exam. ?Negative Homan's sign. ?No cords or calf tenderness. ?No significant calf/ankle edema. ? ?Recent Labs  ?  01/19/22 ?1922 01/21/22 ?03/23/22  ?HGB 13.7 11.4*  ?HCT 39.7 33.2*  ?WBC 8.9 13.9*  ?PLT 268 218  ? ? ?Assessment/Plan: ?26 y.o. G1P0000 postpartum day # 1 ? ?-Continue routine postpartum care ?-Lactation consult PRN for breastfeeding -Discussed contraceptive options including implant, IUDs hormonal and non-hormonal, injection, pills/ring/patch, condoms, and NFP.  ?-Acute blood loss anemia - hemodynamically stable and asymptomatic; start PO ferrous sulfate BID with stool softeners  ?-Immunization status:   all immunizations up to date ? ?-Desires a ParaGard IUD PP ? ? ?Disposition: Continue inpatient postpartum care  ? LOS: 2 days  ? ?Kealii Thueson LUCY 30, CNM ?01/21/2022, 9:47 AM  ? ?----- ?03/23/2022 ?Certified Nurse Midwife ?[facility] ?[facility]  ?

## 2022-01-22 MED ORDER — DIBUCAINE (PERIANAL) 1 % EX OINT: 1.0000 "application " | TOPICAL_OINTMENT | CUTANEOUS | Status: AC | PRN

## 2022-01-22 MED ORDER — ACETAMINOPHEN 325 MG PO TABS: 650.0000 mg | ORAL_TABLET | ORAL | Status: AC | PRN

## 2022-01-22 MED ORDER — WITCH HAZEL-GLYCERIN EX PADS: 1.0000 "application " | MEDICATED_PAD | CUTANEOUS | 12 refills | Status: AC | PRN

## 2022-01-22 MED ORDER — BENZOCAINE-MENTHOL 20-0.5 % EX AERO: 1.0000 "application " | INHALATION_SPRAY | CUTANEOUS | Status: AC | PRN

## 2022-01-22 MED ORDER — COCONUT OIL OIL: 1.0000 "application " | TOPICAL_OIL | 0 refills | Status: AC | PRN

## 2022-01-22 MED ORDER — IBUPROFEN 600 MG PO TABS: 600.0000 mg | ORAL_TABLET | Freq: Four times a day (QID) | ORAL | 0 refills | Status: AC

## 2022-01-22 MED ORDER — FERROUS SULFATE 325 (65 FE) MG PO TABS: 325.0000 mg | ORAL_TABLET | Freq: Two times a day (BID) | ORAL | 3 refills | Status: AC

## 2022-01-22 MED ORDER — SIMETHICONE 80 MG PO CHEW: 80.0000 mg | CHEWABLE_TABLET | ORAL | 0 refills | Status: AC | PRN

## 2022-01-22 MED ORDER — DOCUSATE SODIUM 100 MG PO CAPS: 100.0000 mg | ORAL_CAPSULE | Freq: Two times a day (BID) | ORAL | 0 refills | Status: AC

## 2022-01-22 NOTE — Progress Notes (Signed)
Patient discharged with infant. Discharge instructions, prescriptions, and follow up appointments given to and reviewed with patient with the use of an interpreter. Patient verbalized understanding. Will be escorted out by axillary.  °

## 2022-01-23 ENCOUNTER — Encounter: Payer: Self-pay | Admitting: Family Medicine

## 2022-01-23 NOTE — Progress Notes (Unsigned)
Patient ID: Theresa Chase, female   DOB: 26-Feb-1996, 26 y.o.   MRN: [id number] ?[facility] Department Maternity Care Conference ? ?Maternity Care Conference Date: 01/23/22 ? ?Theresa Chase was identified by clinical staff to benefit from an interdisciplinary team approach to help improve pregnancy care.  The [facility] Maternity Care Conference includes the maternity clinic coordinator (RN), medical providers (MD/APP staff), Care Management -OBCM and Healthy Beginnings, Centering Pregnancy coordinator, Infant Mortality reduction Dietitian.  Nursing staff are also encouraged to participate. The group meets monthly to discuss patient care and coordinate services.  ? ?The patient's care care at the agency was reviewed in EMR and high risk factors evaluated in an interdisciplinary approach.   ? ?Value added interventions discussed at this care conference today were: ? ? ?Theresa Chase Meeting 3/9 ?Patients diet has been controlled well ?Sugars are controlled  ?Induction was set for 3/10 ?Theresa Chase  ?

## 2022-03-06 ENCOUNTER — Ambulatory Visit: Payer: Medicaid Other | Admitting: Advanced Practice Midwife

## 2022-03-06 ENCOUNTER — Encounter: Payer: Self-pay | Admitting: Advanced Practice Midwife

## 2022-03-06 ENCOUNTER — Telehealth: Payer: Self-pay | Admitting: Surgery

## 2022-03-06 DIAGNOSIS — O24410 Gestational diabetes mellitus in pregnancy, diet controlled: Secondary | ICD-10-CM | POA: Diagnosis not present

## 2022-03-06 LAB — HM HIV SCREENING LAB: HM HIV Screening: NEGATIVE

## 2022-03-06 LAB — WET PREP FOR TRICH, YEAST, CLUE
Trichomonas Exam: NEGATIVE
Yeast Exam: NEGATIVE

## 2022-03-06 LAB — HEMOGLOBIN, FINGERSTICK: Hemoglobin: 13.4 g/dL (ref 11.1–15.9)

## 2022-03-06 NOTE — Progress Notes (Signed)
[facility] Department ? ?Postpartum Exam ? ?Theresa Chase is a 26 y.o. SHF nonsmoker G1P1001 female who presents for a postpartum visit. She is 6 weeks postpartum following a normal spontaneous vaginal delivery. IOL for gestational diabetes with cytotec, pit, SROM with SVD on 01/20/22 at 40 1/7 F 7#2. Bottlefeeding q 3 hours (2 oz). Baby weighed 10 lbs 15 days ago. No help with newborn. Living with her uncle. Denies pp sex. No more lochia. Not working. Last pap 07/14/21 neg. Denies cigs, cigars, MJ. Last vaped 04/2021. Last ETOH 05/2021 (1 beer).  I have fully reviewed the prenatal and intrapartum course. The delivery was at 40 1/7 gestational weeks.  Anesthesia: epidural. Postpartum course has been wnl. Baby is doing well. Baby is feeding by bottle - [employer] Good Start. Bleeding no bleeding. Bowel function is normal. Bladder function is normal. Patient is not sexually active. Contraception method is abstinence. Postpartum depression screening: negative. ? ? ?The pregnancy intention screening data noted above was reviewed. Potential methods of contraception were discussed. The patient elected to proceed with Abstinence. ? ? ? ?Health Maintenance Due  ?Topic Date Due  ? HPV VACCINES (1 - 2-dose series) Never done  ? HIV Screening  Never done  ? COVID-19 Vaccine (3 - Booster for Pfizer series) 07/25/2020  ? ? ?The following portions of the patient's history were reviewed and updated as appropriate: allergies, current medications, past family history, past medical history, past social history, past surgical history, and problem list. ? ?Review of Systems ?Pertinent items are noted in HPI. ? ?Objective:  ?BP 93/68   Pulse 72   Ht 5\' 4"  (1.626 m)   Wt 142 lb (64.4 kg)   Breastfeeding No   BMI 24.37 kg/m?   ? ?General:  alert  ? Breasts:  not indicated  ?Lungs: clear to auscultation bilaterally  ?Heart:  regular rate and rhythm, S1, S2 normal, no murmur, click, rub or gallop  ?Abdomen: normal findings: no  masses palpable   ?Wound N/a  ?GU exam:  normal  ?     ?Assessment:  ? ? 1. Postpartum exam ?Wnl pp exam ? ?2. Diet controlled gestational diabetes mellitus (GDM) in third trimester dx'd 11/02/21 ?States will return 03/13/22 for 2 hour GTT ? ? ?6 wk postpartum exam.  ? ?Plan:  ? ?Essential components of care per ACOG recommendations: ? ?1.  Mood and well being: Patient with negative depression screening today. Reviewed local resources for support.  ?- Patient tobacco use? No.   ?- hx of drug use? No.   ? ?2. Infant care and feeding:  ?-Patient currently breastmilk feeding? No.  ?-Social determinants of health (SDOH) reviewed in EPIC. No concerns ? ?3. Sexuality, contraception and birth spacing ?- Patient does not want a pregnancy in the next year.  Desired family size is 1 children.  ?- Reviewed reproductive life planning. Reviewed options based on patient desire and reproductive life plan. Patient is interested in IUD or IUS. This was not provided to the patient today because wants to return 03/13/22 for 2 hour GTT and IUD insertion ? ?Risks, benefits, and typical effectiveness rates were reviewed.  Questions were answered.  Written information was also given to the patient to review.   ? ?The patient will follow up in  7 days for surveillance.  The patient was told to call with any further questions, or with any concerns about this method of contraception.  Emphasized use of condoms 100% of the time for STI prevention. ? ?  Patient was not offered ECP based on not meeting criteria. . Patient was offered ECP. Reviewed options and patient desired No method of ECP, declined all   ? ?- Discussed birth spacing of 18 months ? ?4. Sleep and fatigue ?-Encouraged family/partner/community support of 4 hrs of uninterrupted sleep to help with mood and fatigue ? ?5. Physical Recovery  ?- Discussed patients delivery and complications. She describes her labor as good. ?- Patient had a Vaginal, no problems at delivery. Patient had a  sulcus laceration. Perineal healing reviewed. Patient expressed understanding ?- Patient has urinary incontinence? No. ?- Patient is not safe to resume physical and sexual activity ? ?6.  Health Maintenance ?- HM due items addressed Yes ?- Last pap smear No results found for: DIAGPAP Pap smear not done at today's visit.  ?-Breast Cancer screening indicated? No.  ? ?7. Chronic Disease/Pregnancy Condition follow up:  Needs LTBI tx. Liiving in [place] but no one has contacted her yet. Chatted M. Dorminey, RN to assist with coordinating ?Needs pp 2 hour GTT ?Wants IUD--please assist pt with scheduling ? ?- PCP follow up ? ?Alberteen Spindle, CNM ?Center for [employer], [facility] ? ? ?

## 2022-03-06 NOTE — Telephone Encounter (Signed)
Pt lives in [place], now that she has delivered is interested in tx for LTBI. QFT results, CXR (neg) and EPI faxed to CD nurse Silas Flood to follow up with patient and start tx. ? ?Jennye Moccasin, MD  ?

## 2022-03-06 NOTE — Progress Notes (Signed)
Test prep instructions for 03/13/2022 2 hour GTT given with stated understanding by client. Jossie Ng, RN ?Wet prep and hgb reviewed - no interventions required per standing order. Jossie Ng, RN ? ? ?

## 2022-03-13 ENCOUNTER — Ambulatory Visit: Payer: Medicaid Other

## 2022-03-13 ENCOUNTER — Ambulatory Visit (LOCAL_COMMUNITY_HEALTH_CENTER): Payer: Medicaid Other | Admitting: Nurse Practitioner

## 2022-03-13 NOTE — Progress Notes (Signed)
Patient here for IUD and 2 Hour GTT. Will need to reschedule IUD placement: provider not available. LMP:  June 1st of last year, no periods since had baby. Last sexual intercourse September 2022.                                  ?Spanish Interpretor utilized via [employer] # [phone] for nurse and lab instructions. Delynn Flavin RN ?

## 2022-03-14 LAB — GLUCOSE TOLERANCE, 2 HOURS
Glucose, 2 hour: 82 mg/dL (ref 70–139)
Glucose, GTT - Fasting: 89 mg/dL (ref 70–99)

## 2022-03-14 NOTE — Addendum Note (Signed)
Addended by: Glenna Fellows on: 03/14/2022 03:26 PM ? ? Modules accepted: Level of Service ? ?

## 2022-03-19 ENCOUNTER — Encounter: Payer: Self-pay | Admitting: Obstetrics and Gynecology

## 2022-03-20 ENCOUNTER — Ambulatory Visit: Payer: Medicaid Other

## 2022-03-21 ENCOUNTER — Ambulatory Visit: Payer: Medicaid Other

## 2022-03-31 IMAGING — US US OB COMP +14 WK
1 series · 15 of 28 positions shown · non-contrast
Comparison: none

CLINICAL DATA: Second trimester pregnancy for fetal anatomy survey.

EXAM:
OBSTETRICAL ULTRASOUND >14 WKS AND TRANSVAGINAL OB ULTRASOUND

[Series 1: us ob comp + 14 wk · 15 of 91 slices shown]
[im 1/91]
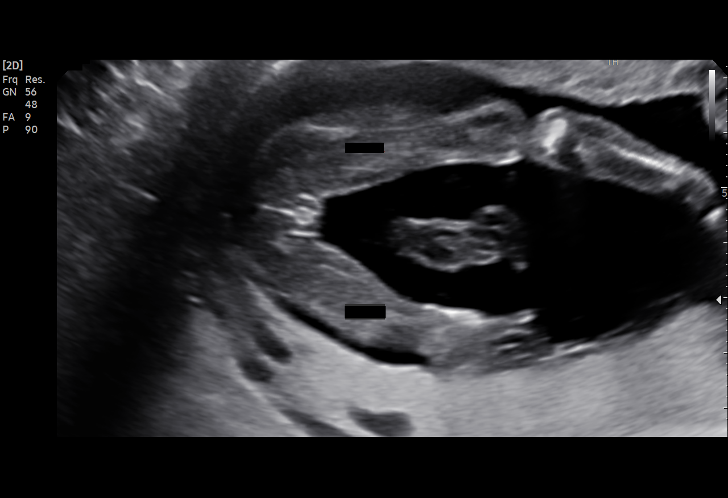
[im 7/91]
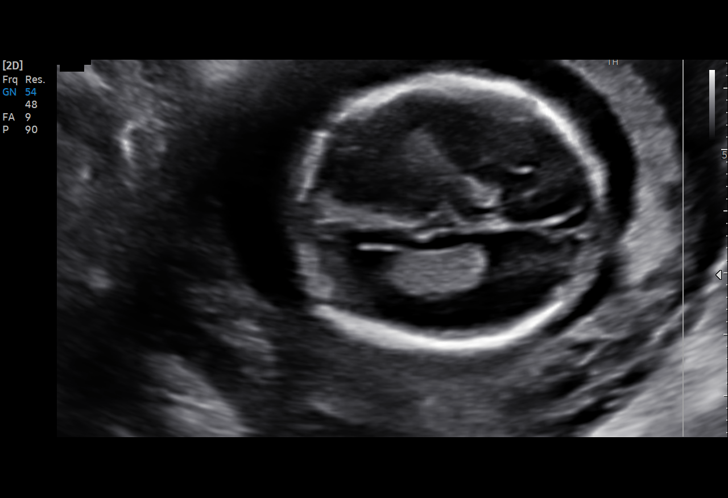
[im 14/91]
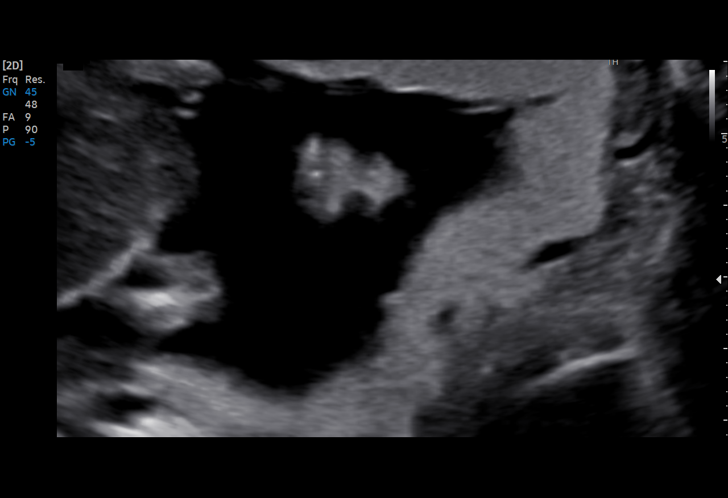
[im 21/91]
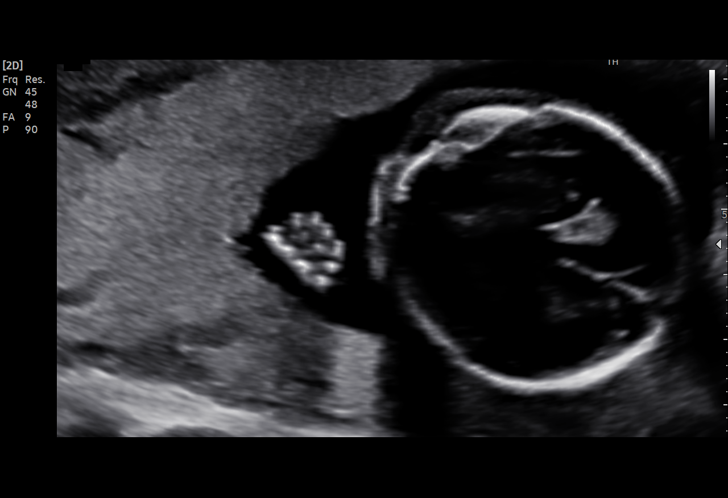
[im 27/91]
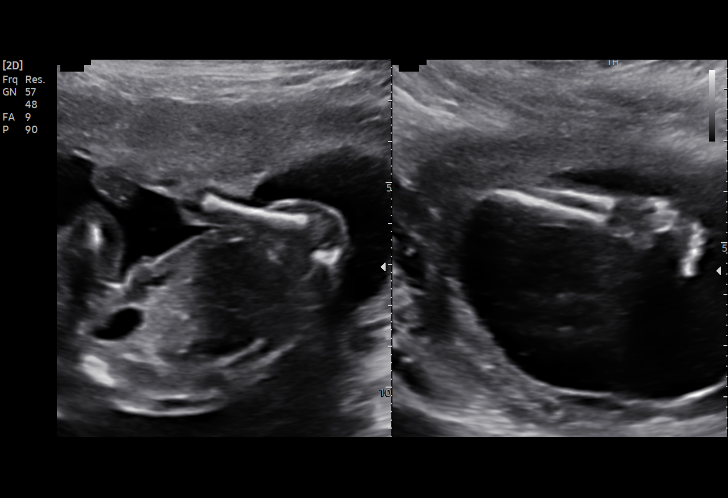
[im 34/91]
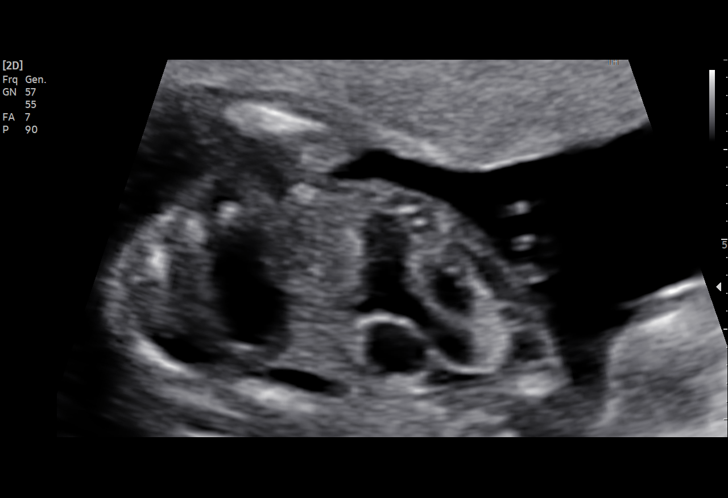
[im 41/91]
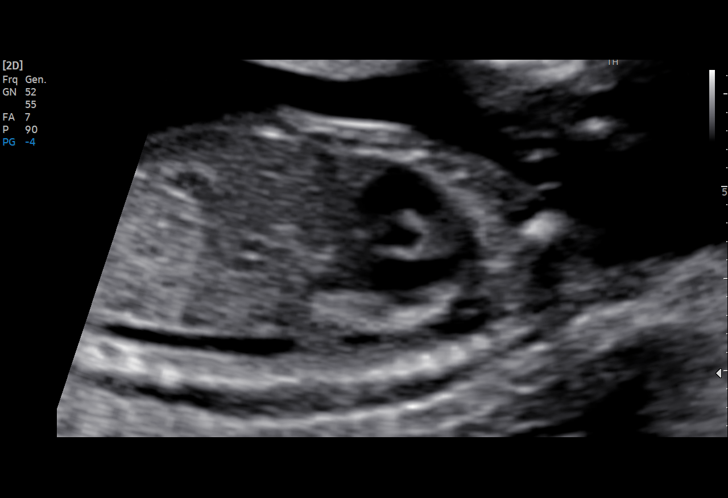
[im 47/91]
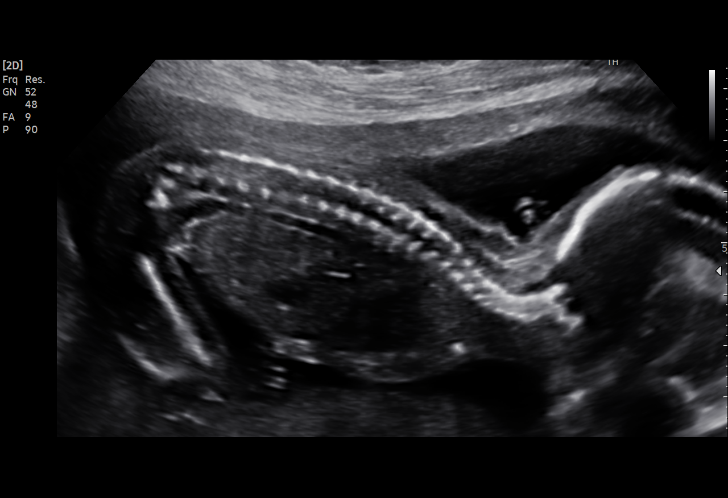
[im 51/91]
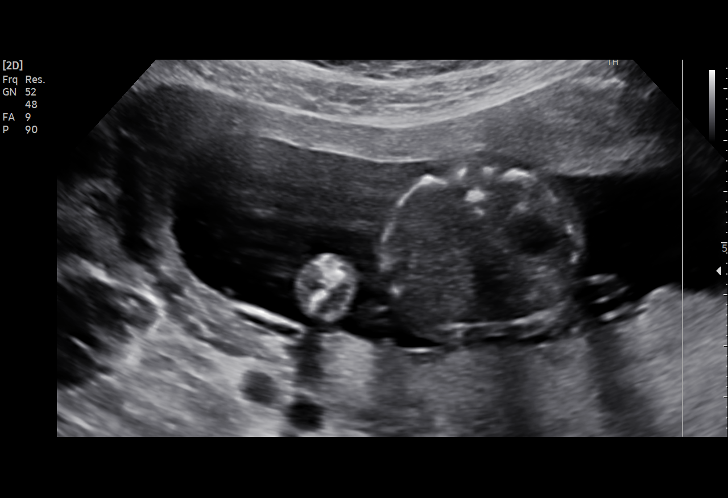
[im 57/91]
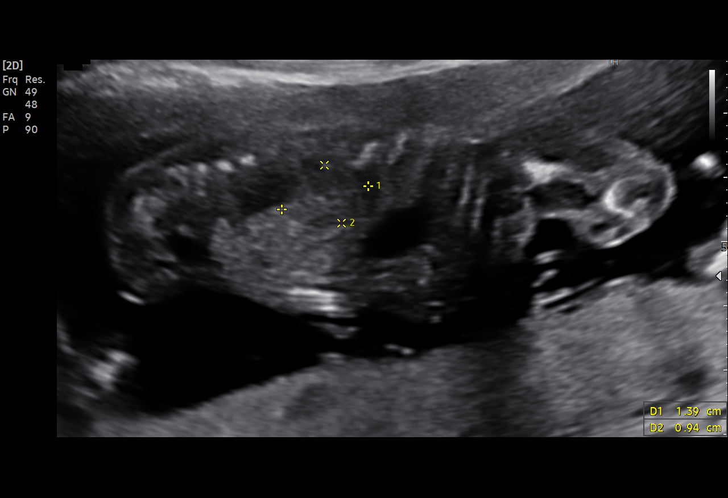
[im 64/91]
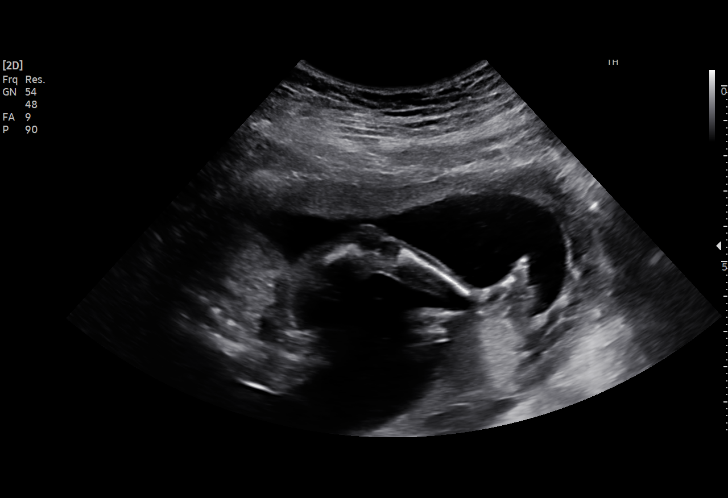
[im 71/91]
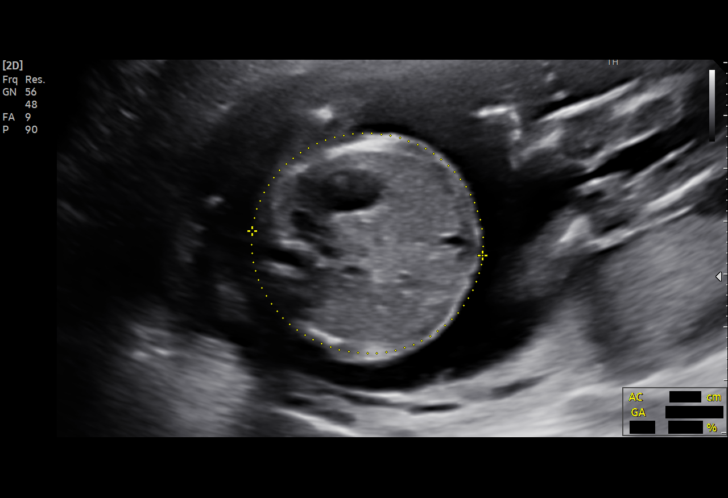
[im 77/91]
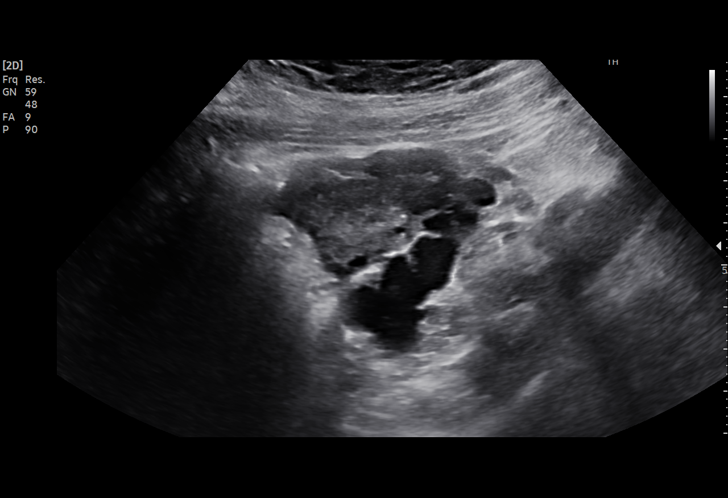
[im 84/91]
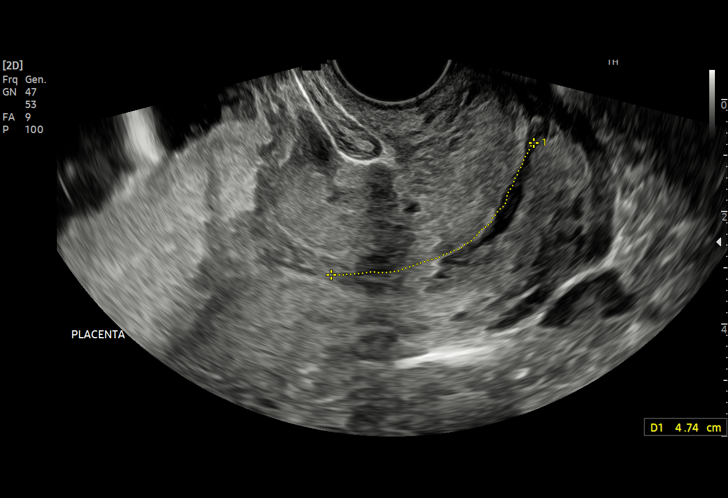
[im 91/91]
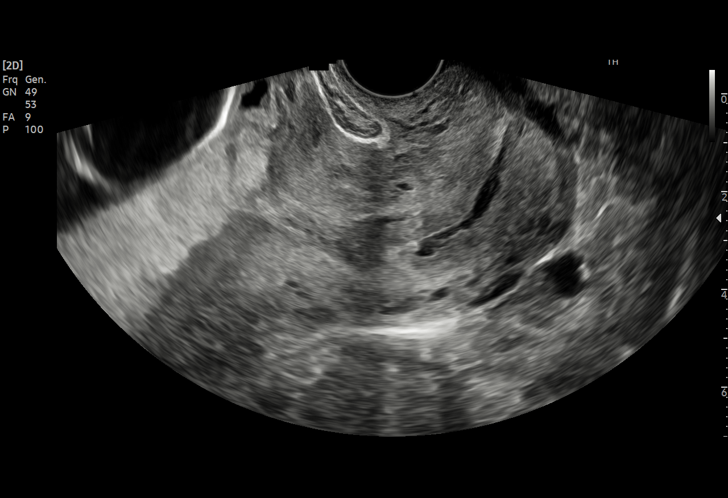

[15 of 28 positions shown; findings below may reference images not displayed]

FINDINGS: Number of Fetuses: 1

Heart Rate:  153 bpm

Movement: Yes

Presentation: Cephalic

Previa: Asymmetric complete placenta previa

Placental Location: Posterior

Amniotic Fluid (Subjective): Within normal limits

Amniotic Fluid (Objective):

Vertical pocket = 4.5cm

FETAL BIOMETRY

BPD: 4.4cm 19w 3d

HC:   15.8cm 18w 5d

AC:   13.2cm 18w 5d

FL:   2.8cm 18w 5d

Current Mean GA: 18w 5d US EDC: 01/18/2022

Assigned GA:  18w 5d Assigned EDC: 01/18/2022

FETAL ANATOMY

Lateral Ventricles: Appears normal

Thalami/CSP: Appears normal

Posterior Fossa:  Appears normal

Nuchal Region: Appears normal   NFT= 2.7 mm

Upper Lip: Appears normal

Spine: Appears normal

4 Chamber Heart on Left: Appears normal

LVOT: Appears normal

RVOT: Appears normal

Stomach on Left: Appears normal

3 Vessel Cord: Appears normal

Cord Insertion site: Appears normal

Kidneys: Appears normal

Bladder: Appears normal

Extremities: Appears normal

Maternal Findings:

Cervix:  4.6 cm TV
IMPRESSION: Assigned GA currently 18 weeks 5 days.  Appropriate fetal growth.

Unremarkable fetal anatomic survey.  No fetal anomalies identified.

Asymmetric complete placenta previa. Recommend continued follow-up
by ultrasound in 6 weeks.

## 2022-05-29 IMAGING — US US MFM OB COMP +14 WKS
1 series · 14 of 28 positions shown · non-contrast
Comparison: none

[Series 1: us mfm ob comp +14 wks · 53 acquisitions, 14 frames shown]
[im 2/53]
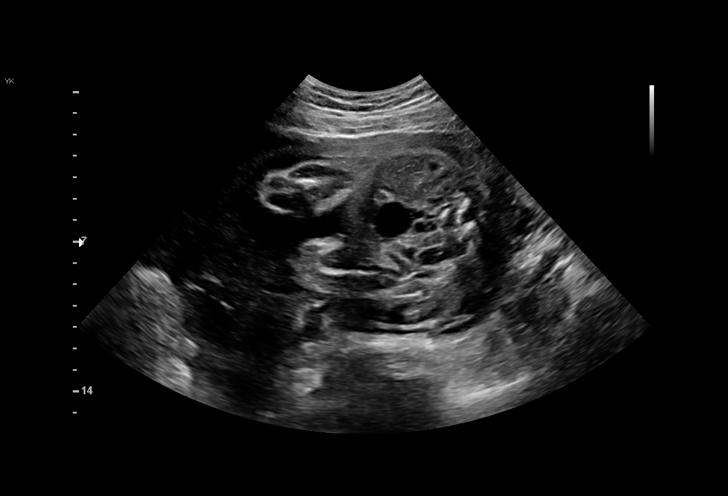
[im 6/53]
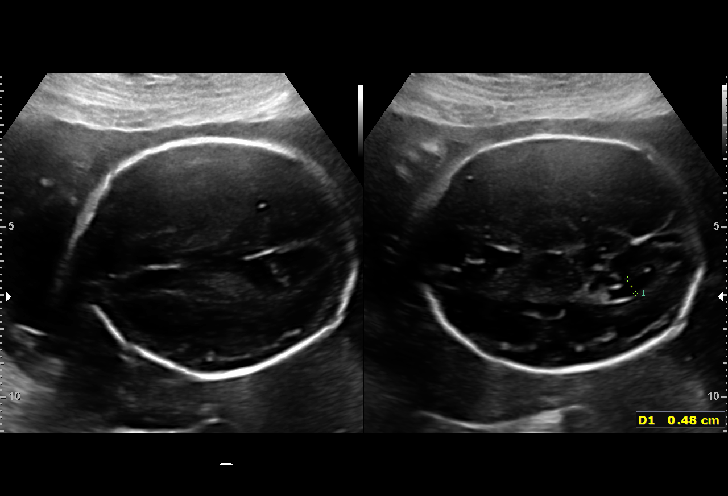
[im 10/53]
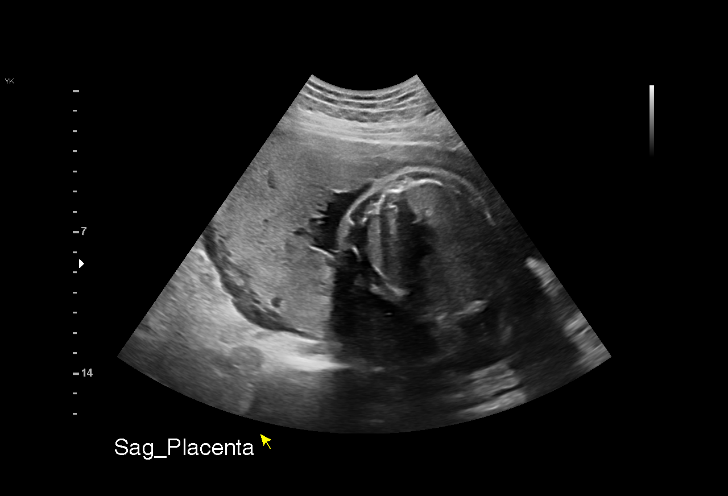
[im 14/53]
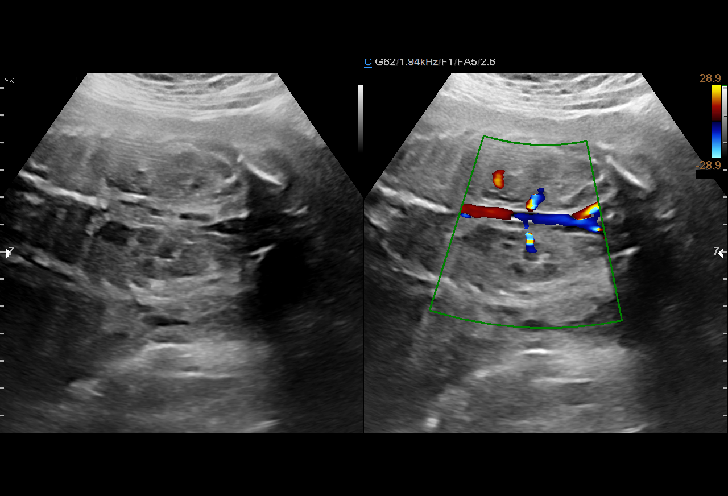
[im 18/53]
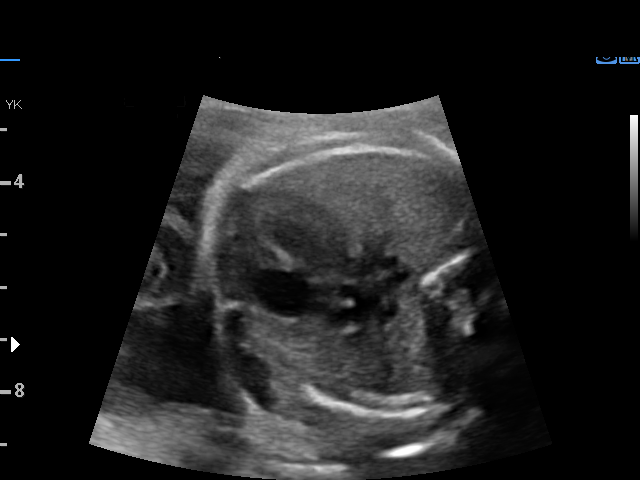
[im 22/53]
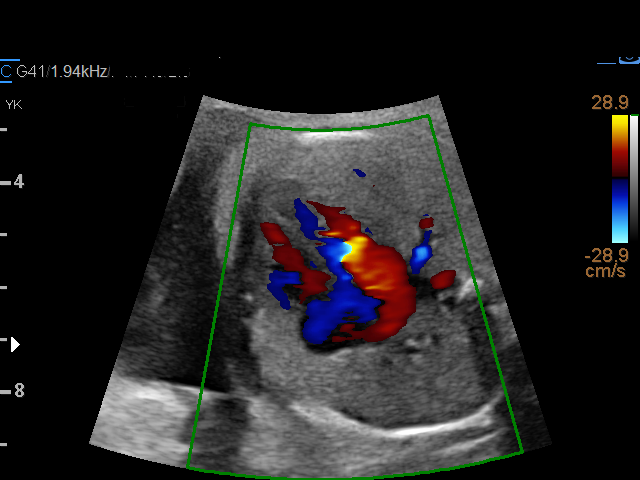
[im 26/53]
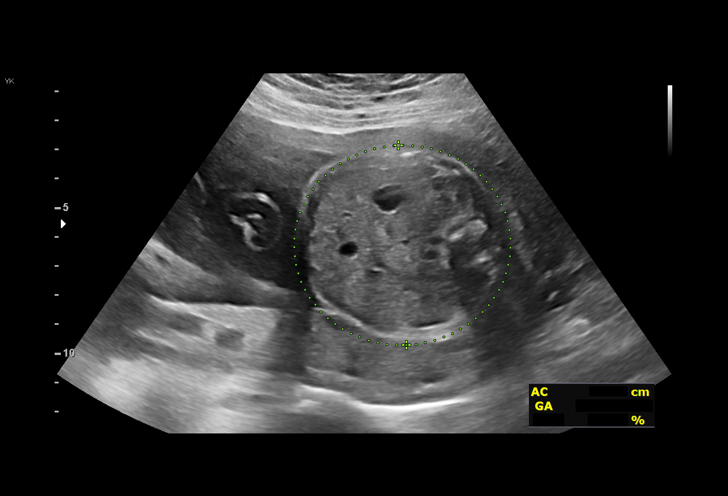
[im 29/53]
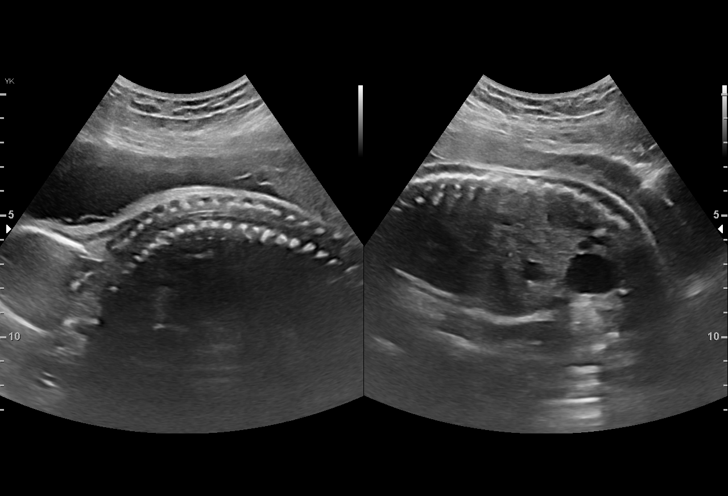
[im 33/53]
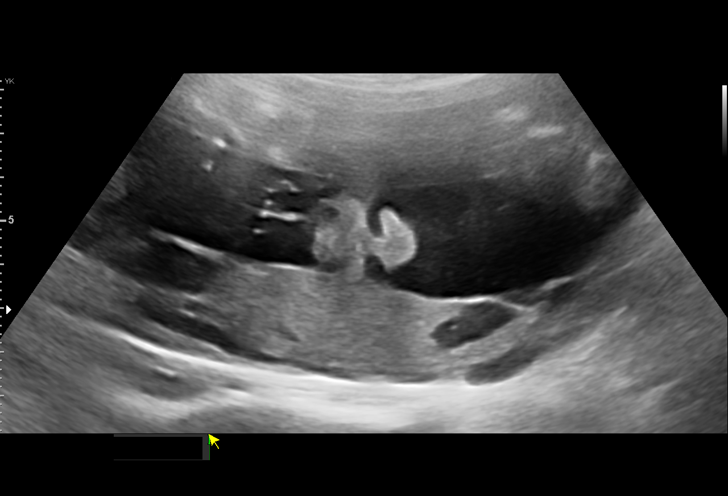
[im 37/53]
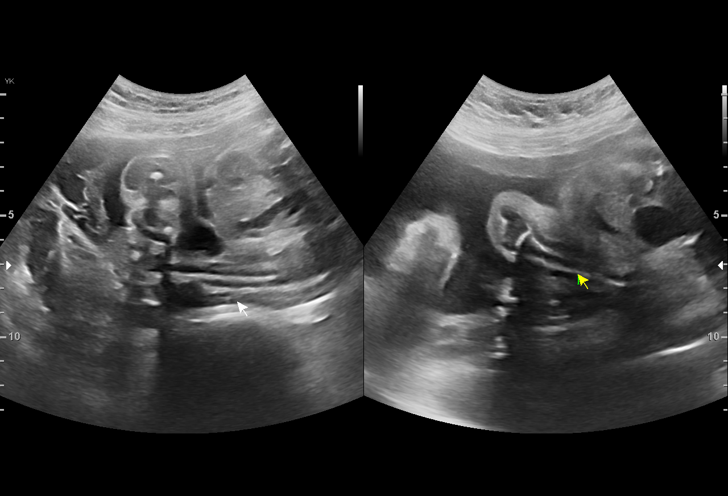
[im 41/53]
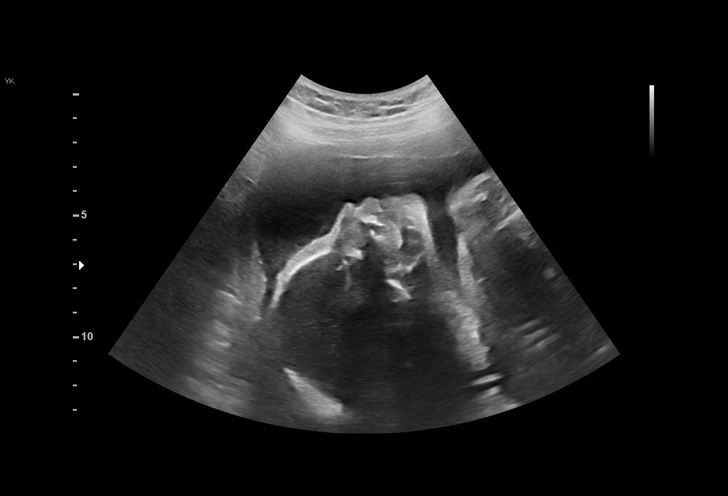
[im 45/53]
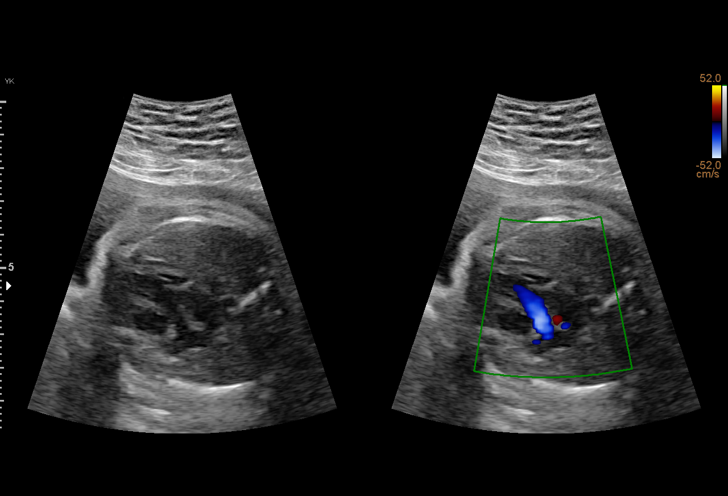
[im 49/53]
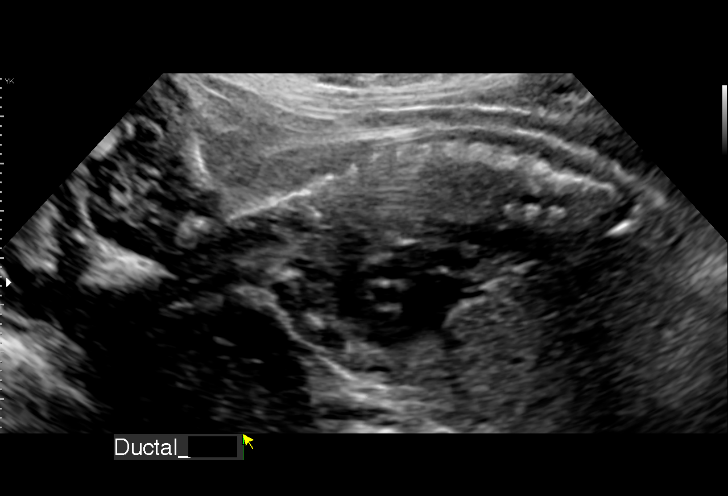
[im 53/53]
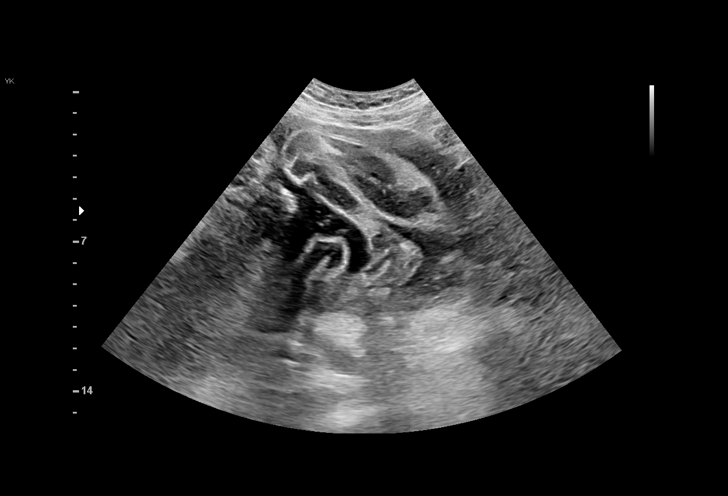

[14 of 28 positions shown; findings below may reference images not displayed]

1  US MFM OB COMP + 14 WK                76805.01    MUNTHER AISH

Indications

 Encounter for antenatal screening, (placenta
 previa)
 AFP Tetra: Neg
 27 weeks gestation of pregnancy
Fetal Evaluation

 Num Of Fetuses:         1
 Fetal Heart Rate(bpm):  130
 Cardiac Activity:       Observed
 Presentation:           Breech
 Placenta:               Left postero- lateral

 Amniotic Fluid
 AFI FV:      Within normal limits

                             Largest Pocket(cm)


 Comment:    No placental abruption or previa identified.
Biometry

 BPD:      70.2  mm     G. Age:  28w 1d         78  %    CI:        73.88   %    70 - 86
                                                         FL/HC:      19.7   %    18.6 -
 HC:      259.4  mm     G. Age:  28w 1d         62  %    HC/AC:      1.18        1.05 -
 AC:      220.5  mm     G. Age:  26w 3d         26  %    FL/BPD:     72.8   %    71 - 87
 FL:       51.1  mm     G. Age:  27w 3d         46  %    FL/AC:      23.2   %    20 - 24

 Est. FW:    8628  gm      2 lb 4 oz     40  %
Gestational Age
 U/S Today:     27w 4d                                        EDD:   01/15/22
 Best:          27w 0d     Det. By:  Early Ultrasound         EDD:   01/19/22
                                     (05/28/21)
Anatomy

 Cranium:               Appears normal         Aortic Arch:            Appears normal
 Cavum:                 Appears normal         Ductal Arch:            Appears normal
 Ventricles:            Appears normal         Diaphragm:              Appears normal
 Choroid Plexus:        Appears normal         Stomach:                Appears normal, left
                                                                       sided
 Cerebellum:            Appears normal         Abdomen:                Appears normal
 Posterior Fossa:       Appears normal         Abdominal Wall:         Appears nml (cord
                                                                       insert, abd wall)
 Nuchal Fold:           Not applicable (>20    Cord Vessels:           Appears normal (3
                        wks GA)                                        vessel cord)
 Face:                  Appears normal         Kidneys:                Appear normal
                        (orbits and profile)
 Lips:                  Appears normal         Bladder:                Appears normal
 Thoracic:              Appears normal         Spine:                  Appears normal
 Heart:                 Appears normal         Upper Extremities:      Visualized
                        (4CH, axis, and
                        situs)
 RVOT:                  Appears normal         Lower Extremities:      Visualized
 LVOT:                  Appears normal

 Other:  Lt feet visulazid but limited imaging  (due to position). Technically
         difficult due to fetal position.
Cervix Uterus Adnexa

 Cervix
 Length:           4.17  cm.
 Normal appearance by transabdominal scan.

 Uterus
 No abnormality visualized.
Impression

 G1 P0.  Patient is here for fetal anatomy scan.  On previous
 ultrasound, placenta previa was suspected.  Patient does not
 give history of vaginal bleeding.
 On tetra screening, the risks of fetal aneuploidies and open
 neural tube defects are not increased.

 We performed a fetal anatomical survey.  No markers of
 aneuploidies or fetal structural defects are seen.  Fetal
 growth is appropriate for gestational age.  Amniotic fluid is
 normal and good fetal activity seen.  Placenta is left lateral
 and there is no evidence of previa or low-lying placenta.
 We did not perform transvaginal ultrasound.
                 Lor, Cherlo

## 2022-07-31 IMAGING — US US MFM OB FOLLOW-UP
1 series · 14 of 25 positions shown · non-contrast
Comparison: none

[Series 1: us mfm ob follow-up · 25 acquisitions, 14 frames shown]
[im 1/25]
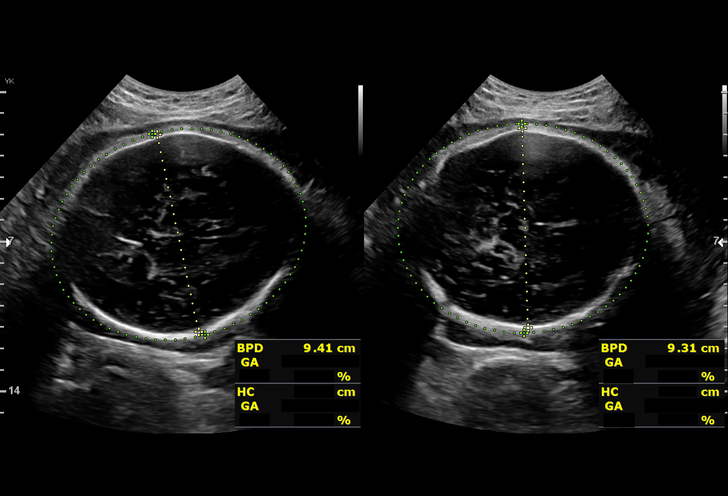
[im 3/25]
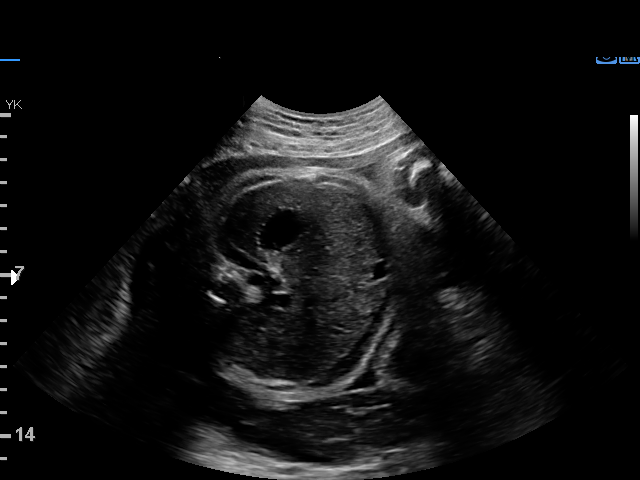
[im 5/25]
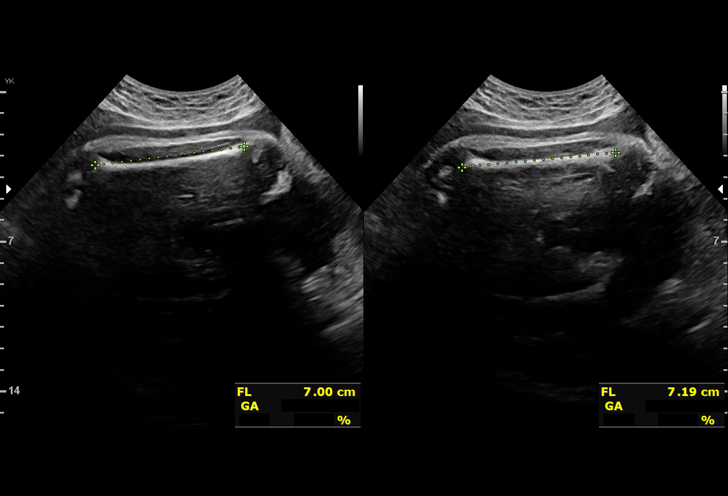
[im 7/25]
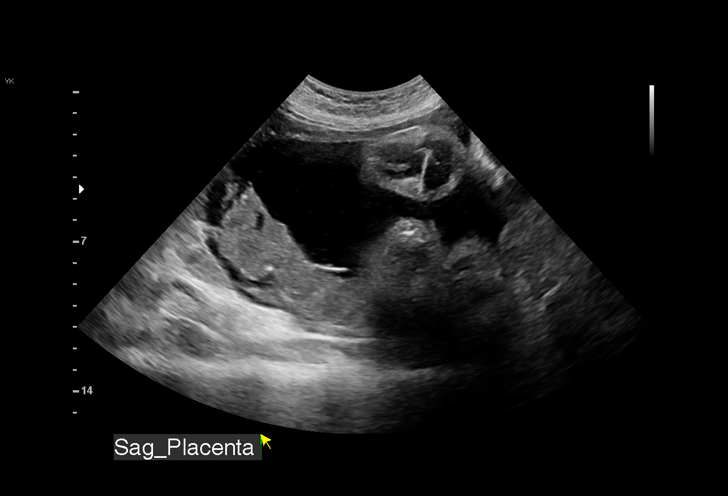
[im 9/25]
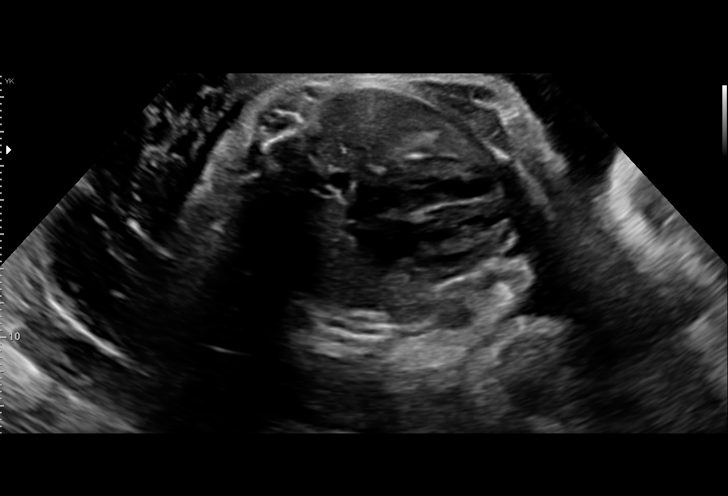
[im 10/25]
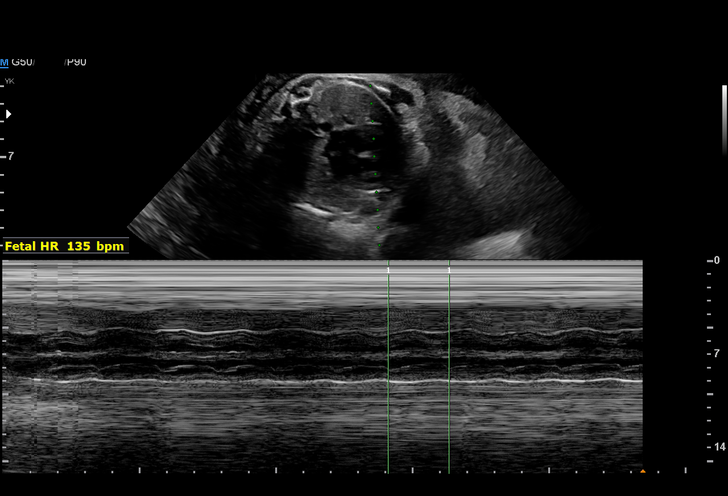
[im 12/25]
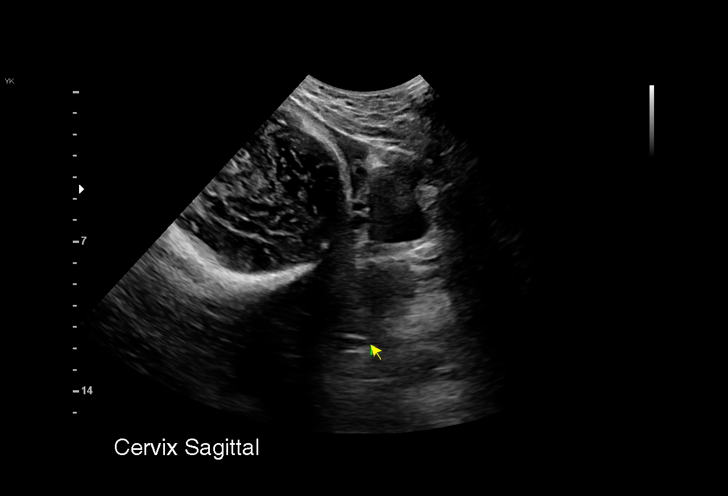
[im 14/25]
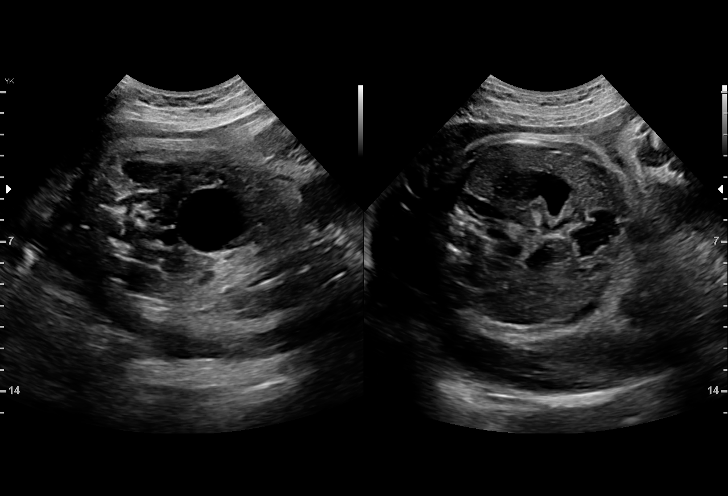
[im 16/25]
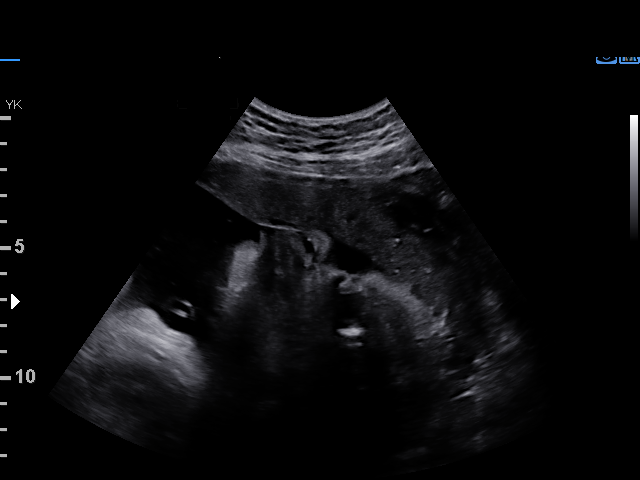
[im 17/25]
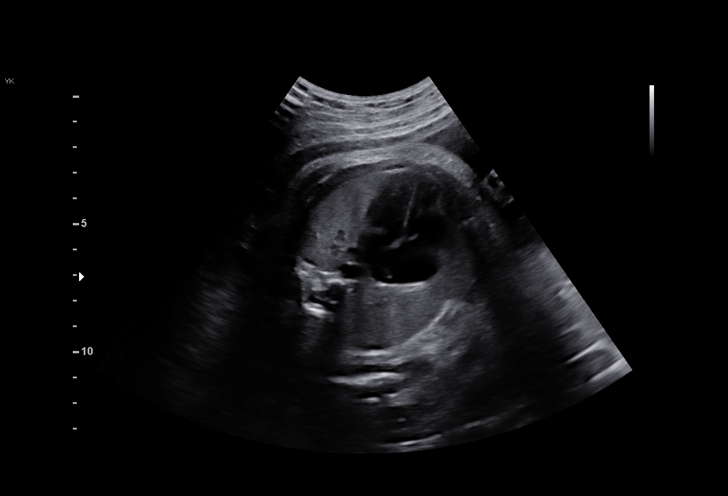
[im 19/25]
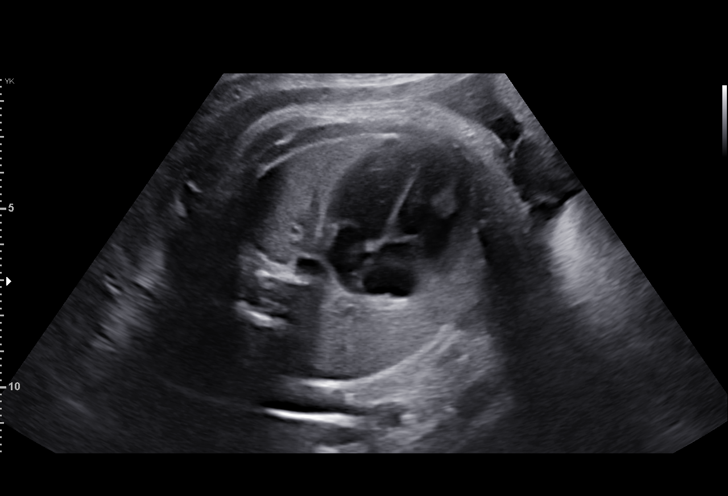
[im 21/25]
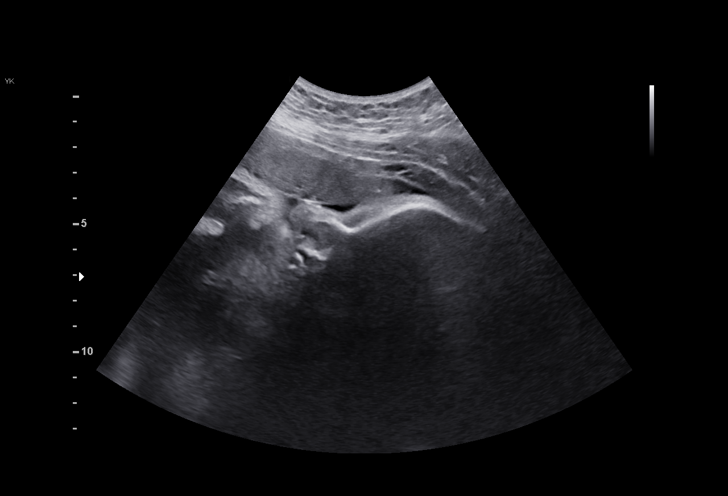
[im 23/25]
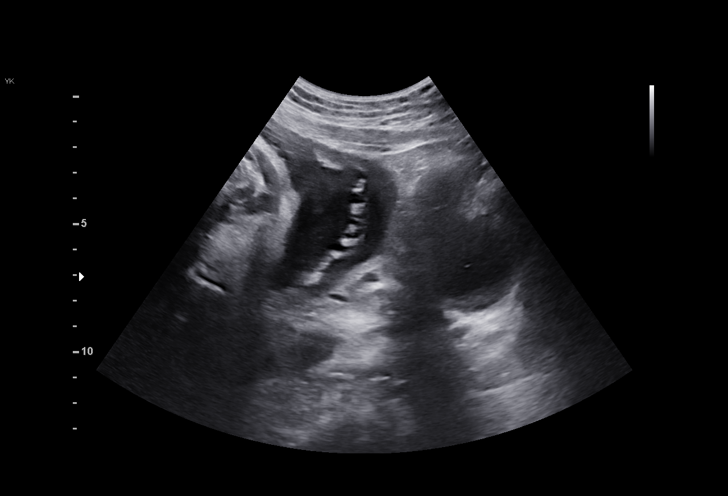
[im 25/25]
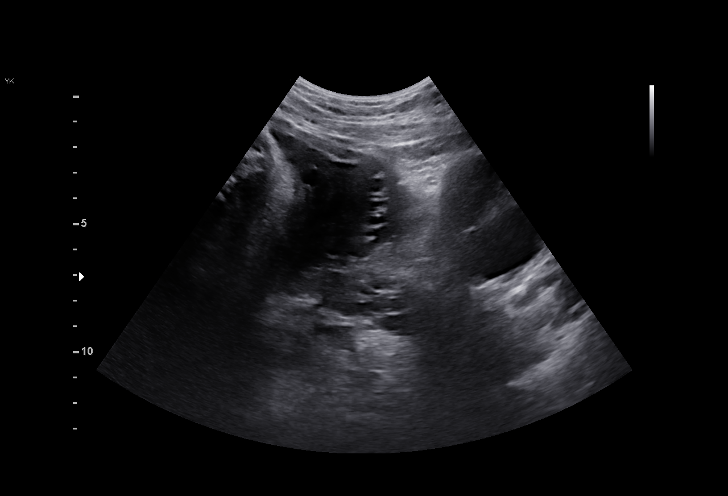

[14 of 25 positions shown; findings below may reference images not displayed]

Indications

 Gestational diabetes in pregnancy, diet
 controlled
 Encounter for antenatal screening, (placenta
 previa-resolved )
 36 weeks gestation of pregnancy
 AFP Tetra: Neg
Fetal Evaluation

 Num Of Fetuses:         1
 Fetal Heart Rate(bpm):  135
 Cardiac Activity:       Observed
 Presentation:           Cephalic
 Placenta:               Posterior

 Amniotic Fluid
 AFI FV:      Within normal limits

 AFI Sum(cm)     %Tile       Largest Pocket(cm)
 17.6            65

 RUQ(cm)       RLQ(cm)       LUQ(cm)        LLQ(cm)
 4.2           2.9           5.5            5
Biometry

 BPD:      93.6  mm     G. Age:  38w 1d         96  %    CI:        75.38   %    70 - 86
                                                         FL/HC:      20.8   %    20.1 -
 HC:      341.9  mm     G. Age:  39w 3d         92  %    HC/AC:      1.02        0.93 -
 AC:      333.9  mm     G. Age:  37w 2d         89  %    FL/BPD:     76.0   %    71 - 87
 FL:       71.1  mm     G. Age:  36w 3d         57  %    FL/AC:      21.3   %    20 - 24

 Est. FW:    3533  gm      7 lb 1 oz     86  %
Gestational Age

 U/S Today:     37w 6d                                        EDD:   01/06/22
 Best:          36w 0d     Det. By:  Early Ultrasound         EDD:   01/19/22
                                     (05/28/21)
Anatomy

 Cranium:               Appears normal         Aortic Arch:            Previously seen
 Cavum:                 Appears normal         Ductal Arch:            Previously seen
 Ventricles:            Appears normal         Diaphragm:              Appears normal
 Choroid Plexus:        Previously seen        Stomach:                Appears normal, left
                                                                       sided
 Cerebellum:            Previously seen        Abdomen:                Appears normal
 Posterior Fossa:       Previously seen        Abdominal Wall:         Appears nml (cord
                                                                       insert, abd wall)
 Nuchal Fold:           Not applicable (>20    Cord Vessels:           Appears normal (3
                        wks GA)                                        vessel cord)
 Face:                  Orbits and profile     Kidneys:                Appear normal
                        previously seen
 Lips:                  Previously seen        Bladder:                Appears normal
 Thoracic:              Appears normal         Spine:                  Previously seen
 Heart:                 Appears normal         Upper Extremities:      Visualized
                        (4CH, axis, and
                        situs)
 RVOT:                  Previously seen        Lower Extremities:      Visualized
 LVOT:                  Previously seen
Comments

 This patient was seen for a follow up growth scan due to
 recently diagnosed gestational diabetes.  She reports that her
 fingerstick values have mostly been within normal limits.
 She was informed that the fetal growth and amniotic fluid
 level appears appropriate for her gestational age.
 Due to gestational diabetes, delivery should probably occur at
 around 39 weeks.
 All conversations were held with the patient today with the
 help of a Spanish interpreter.

## 2022-10-10 NOTE — Telephone Encounter (Signed)
Done

## 2024-04-25 ENCOUNTER — Ambulatory Visit: Admitting: Nurse Practitioner

## 2024-04-25 ENCOUNTER — Ambulatory Visit

## 2024-04-25 VITALS — BP 108/65 | HR 66 | Ht 64.0 in | Wt 174.2 lb

## 2024-04-25 DIAGNOSIS — Z01419 Encounter for gynecological examination (general) (routine) without abnormal findings: Secondary | ICD-10-CM

## 2024-04-25 DIAGNOSIS — Z30430 Encounter for insertion of intrauterine contraceptive device: Secondary | ICD-10-CM | POA: Diagnosis not present

## 2024-04-25 DIAGNOSIS — Z113 Encounter for screening for infections with a predominantly sexual mode of transmission: Secondary | ICD-10-CM

## 2024-04-25 DIAGNOSIS — Z3009 Encounter for other general counseling and advice on contraception: Secondary | ICD-10-CM

## 2024-04-25 DIAGNOSIS — Z309 Encounter for contraceptive management, unspecified: Secondary | ICD-10-CM | POA: Diagnosis not present

## 2024-04-25 LAB — WET PREP FOR TRICH, YEAST, CLUE
Clue Cell Exam: NEGATIVE
Trichomonas Exam: NEGATIVE
Yeast Exam: NEGATIVE

## 2024-04-25 LAB — HM HIV SCREENING LAB: HM HIV Screening: NEGATIVE

## 2024-04-25 MED ORDER — PARAGARD INTRAUTERINE COPPER IU IUD
1.0000 | INTRAUTERINE_SYSTEM | Freq: Once | INTRAUTERINE | Status: AC
  Administered 2024-04-25: 1 via INTRAUTERINE

## 2024-04-25 NOTE — Progress Notes (Signed)
 Pt is here for family planning visit.  Family planning education card reviewed and given to pt along with paragard card.  Wet prep results reviewed, no treatment required per standing orders. Condoms declined. Caren Channel, RN

## 2024-04-25 NOTE — Progress Notes (Signed)
 [employer] [facility]PART[facility] [address], [address] [place] [address] Main phone: [phone]  Family Planning Visit - Repeat Yearly Visit  Subjective:  Theresa Chase is a 28 y.o. G1P1000  being seen today for an annual wellness visit and to discuss contraception options. The patient is currently using female condom for pregnancy prevention. Patient does not want a pregnancy in the next year.   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 She also reports vaginal itching for the past month.     See flowsheet for further details and programmatic requirements Hyperlink available at the top of the signed note in blue.  Flow sheet content below:  Pregnancy Intention Screening Does the patient want to become pregnant in the next year?: No Does the patient's partner want to become pregnant in the next year?: No Would the patient like to discuss contraceptive options today?: Yes Results Follow up Password: [email or web address] Is it okay to contact you by mail?: Yes Contraception History Past methods of contraception used by patient:: Female Condom Adverse effects associated with Female Condom: none Sexual History What age did you start your period?: 15 How often do you have your period?: monthly Date of last sex?: 04/21/24 Has the patient had unprotected sex within the last 5 days?: No Do you have sex with men, women, both men and  women?: Men only In the past 2 months how many partners have you had sex with?: 1 In the past 12 months, how many partners have you had sex with?: 1 Is it possible that any of your sex partners in the past 12 months had sex with someone else whild they were still in a sexual relationship with you?: No What ways do you have sex?: Vaginal Do you or your partner use condoms and/or dental dams every time you have vaginal, oral or anal sex?: Yes Do you douche?: No Date of last HIV test?: 03/06/22 Have you ever had an STD?: No Have any of your partners had an STD?: No Have you or your partner ever shot up drugs?: No Have any of your partners used drugs in the past?: No Have you or your partners exchanged money or drugs for sex?: No Counseling Education: Make informed decision about family planning, Provided preconception counseling, Reduce risk of transmission and protection from STD's and HIV, Understand BMI >25 or >18.5 is a health risk (weight management educational materials to be provided to client requests), Promoted daily consumption of MVI with folic acid if capable of conceiving., Review immunization history, inform client of recommended vaccines per CDC's ACIP Guidelines and refer to Immunization clinic, Results of physical assessment and labs (if performed), How to discontinue the method selected and information on back up method used, How to use the method selected and information on back up method used, How to use the method consistently and correctly, Teach back method completed, Warning signs for rare but serious adverse events and what to do if they experience a  warning sign (including emergency 24 hour number, where to seek emergency service outside of hours of operation), When to return for follow up (planned return schedule), PCP list given to patient Contraception Wrap Up Current Method: Female Condom End Method: IUD or IUS Contraception Counseling Provided: Yes How was the end  contraceptive method provided?: Provided on site Interpreter Interpreters used: Edd Gong [place]  Diabetes screening This patient is 28 y.o. with a BMI of Body mass index is 29.9 kg/m.Aaron Aas  Is patient eligible for diabetes screening (age >35 and BMI >25)?  no  Was Hgb A1c ordered? no  STI screening Patient reports 1 of partners in last year.  Does this patient desire STI screening?  Yes  Hepatitis C screening Has patient been screened once for HCV in the past?  No  No results found for: HCVAB  Does the patient meet criteria for HCV testing? No  (If yes-- Screen for HCV through [facility]) Criteria:  Since the last HCV result, does the patient have any of the following? - Current drug use - Have a partner with drug use - Has been incarcerated  Hepatitis B screening Does the patient meet criteria for HBV testing? No Criteria:  -Household, sexual or needle sharing contact with HBV -History of drug use -HIV positive -Those with known Hep C  Cervical Cancer Screening  Result Date Procedure Results Follow-ups  07/14/2021 Pap IG (Image Guided) DIAGNOSIS:: Comment Specimen adequacy:: Comment Clinician Provided ICD10: Comment Performed by:: Comment PAP Smear Comment: . Note:: Comment Test Methodology: Comment     Health Maintenance Due  Topic Date Due   HPV VACCINES (1 - 3-dose series) Never done   COVID-19 Vaccine (3 - 2024-25 season) 07/15/2023    The following portions of the patient's history were reviewed and updated as appropriate: allergies, current medications, past family history, past medical history, past social history, past surgical history and problem list. Problem list updated.  Objective:   Vitals:   04/25/24 1005  BP: 108/65  Pulse: 66  Weight: 174 lb 3.2 oz (79 kg)  Height: 5' 4 (1.626 m)    Physical Exam Vitals and nursing note reviewed. Exam conducted with a chaperone present Verdis Glade, RN & Edd Gong).  Constitutional:      Appearance: Normal  appearance.  HENT:     Head: Normocephalic and atraumatic.     Mouth/Throat:     Mouth: Mucous membranes are moist.     Pharynx: Oropharynx is clear. No oropharyngeal exudate or posterior oropharyngeal erythema.  Pulmonary:     Effort: Pulmonary effort is normal.  Chest:  Breasts:    Right: Normal.     Left: Normal.     Comments: ~2cm round yellow to maroon bruises noted scattered bilaterally on breasts. Pt reports this is from consensual sexual activity Abdominal:     General: Abdomen is flat.     Palpations: There is no mass.     Tenderness: There is no abdominal tenderness. There is no rebound.  Genitourinary:    General: Normal vulva.     Exam position: Lithotomy position.     Pubic Area: No rash or pubic lice.      Labia:        Right: No rash or lesion.        Left: No rash or lesion.      Vagina: Normal. No vaginal discharge, erythema, bleeding or lesions.     Cervix: No cervical motion tenderness, discharge, friability, lesion or erythema.  Uterus: Normal.      Adnexa: Right adnexa normal and left adnexa normal.     Rectum: Normal.     Comments: pH less than 4 Swabs collected Lymphadenopathy:     Head:     Right side of head: No preauricular or posterior auricular adenopathy.     Left side of head: No preauricular or posterior auricular adenopathy.     Cervical: No cervical adenopathy.     Upper Body:     Right upper body: No supraclavicular, axillary or epitrochlear adenopathy.     Left upper body: No supraclavicular, axillary or epitrochlear adenopathy.     Lower Body: No right inguinal adenopathy. No left inguinal adenopathy.   Skin:    General: Skin is warm and dry.     Findings: No rash.   Neurological:     Mental Status: She is alert and oriented to person, place, and time.   Psychiatric:        Mood and Affect: Mood normal.        Behavior: Behavior normal.     Assessment and Plan:  OLA RAAP is a 28 y.o. female G1P1000 presenting to the  [facility] Department for an yearly wellness and contraception visit   1. Family planning Contraception counseling:  Reviewed options based on patient desire and reproductive life plan. Patient is interested in IUD or IUS. This was provided to the patient today.   Risks, benefits, and typical effectiveness rates were reviewed.  Questions were answered.  Written information was also given to the patient to review.    The patient will follow up in  6 weeks for surveillance.  The patient was told to call with any further questions, or with any concerns about this method of contraception.  Emphasized use of condoms 100% of the time for STI prevention.  Emergency Contraception Precautions (ECP): Patient assessed for need of ECP. She is not a candidate based on barrier method used 100% correctly during intercourse per patient's confident report.  Educated on ECP and reviewed options.  Patient desires Cu-IUD.   2. Well woman exam (Primary) CBE WNL Last pap 07/14/21 NILM Pt not due for pap for another 3 months, insurance will not cover now. Explained this to the pt  3. Screening for venereal disease  - Syphilis Serology, [facility] - HIV [place] [facility] - Chlamydia/Gonorrhea [facility] - WET PREP FOR TRICH, YEAST, CLUE  4. Encounter for IUD insertion Procedure:  IUD Insertion  Patient presented to [facility] for IUD insertion. Her GC/CT screening will be done today and using WHO criteria we can be reasonably certain she is not pregnant. See Flowsheet for IUD check list.   IUD Insertion Procedure Note Patient identified, informed consent performed, consent signed.   Discussed risks of irregular bleeding, cramping, infection, malpositioning or misplacement of the IUD outside the uterus which may require further procedure such as laparoscopy. Time out was performed.    Speculum placed in the vagina.  Cervix visualized.  Cleaned with Betadine x 2.  Grasped anteriorly with an Alis  tenaculum.  Uterus sounded to 8.5 cm.  IUD placed per manufacturer's recommendations.  Strings trimmed to 3 cm. Tenaculum was removed, good hemostasis noted.  Patient tolerated procedure well.   Patient was given post-procedure instructions- both agency handout and verbally by provider.  Patient was also asked to check IUD strings periodically or follow up in 6 weeks for IUD check.   Due to language barrier,  a Spanish interpreter [place].) was present in person during the history-taking, subsequent discussion, and physical exam with this patient.    Return in about 6 weeks (around 06/06/2024) for IUD string check.  No future appointments.  Tahani Potier K Maddeline Roorda, NP

## 2024-04-25 NOTE — Addendum Note (Signed)
 Addended by: Caren Channel on: 04/25/2024 01:17 PM   Modules accepted: Orders

## 2024-05-06 ENCOUNTER — Encounter: Payer: Self-pay | Admitting: Nurse Practitioner
